# Patient Record
Sex: Female | Born: 1940 | Race: White | Hispanic: No | Marital: Single | Smoking: Never smoker
Health system: Southern US, Community
[De-identification: ages and names within clinical notes are randomized; demographics above are authoritative.]

## PROBLEM LIST (undated history)

## (undated) DIAGNOSIS — F32A Depression, unspecified: Secondary | ICD-10-CM

## (undated) DIAGNOSIS — F329 Major depressive disorder, single episode, unspecified: Secondary | ICD-10-CM

## (undated) DIAGNOSIS — E039 Hypothyroidism, unspecified: Secondary | ICD-10-CM

## (undated) DIAGNOSIS — F209 Schizophrenia, unspecified: Secondary | ICD-10-CM

## (undated) DIAGNOSIS — F79 Unspecified intellectual disabilities: Secondary | ICD-10-CM

## (undated) DIAGNOSIS — R569 Unspecified convulsions: Secondary | ICD-10-CM

## (undated) DIAGNOSIS — Q059 Spina bifida, unspecified: Secondary | ICD-10-CM

## (undated) HISTORY — DX: Spina bifida, unspecified: Q05.9

## (undated) HISTORY — DX: Unspecified convulsions: R56.9

## (undated) HISTORY — PX: CHOLECYSTECTOMY: SHX55

---

## 2004-07-20 ENCOUNTER — Ambulatory Visit: Payer: Self-pay | Admitting: Internal Medicine

## 2004-09-08 ENCOUNTER — Ambulatory Visit: Payer: Self-pay | Admitting: Diagnostic Radiology

## 2004-09-20 ENCOUNTER — Ambulatory Visit: Payer: Self-pay | Admitting: Diagnostic Radiology

## 2005-03-06 ENCOUNTER — Emergency Department: Payer: Self-pay | Admitting: Emergency Medicine

## 2005-05-15 ENCOUNTER — Ambulatory Visit: Payer: Self-pay | Admitting: Internal Medicine

## 2005-08-24 ENCOUNTER — Ambulatory Visit: Payer: Self-pay | Admitting: Internal Medicine

## 2005-08-29 ENCOUNTER — Emergency Department: Payer: Self-pay | Admitting: Emergency Medicine

## 2005-08-29 ENCOUNTER — Other Ambulatory Visit: Payer: Self-pay

## 2005-09-18 ENCOUNTER — Ambulatory Visit: Payer: Self-pay | Admitting: Internal Medicine

## 2006-11-27 ENCOUNTER — Ambulatory Visit: Payer: Self-pay | Admitting: Nurse Practitioner

## 2008-03-16 ENCOUNTER — Ambulatory Visit: Payer: Self-pay | Admitting: Internal Medicine

## 2008-04-09 ENCOUNTER — Other Ambulatory Visit: Payer: Self-pay

## 2008-04-09 ENCOUNTER — Inpatient Hospital Stay: Payer: Self-pay | Admitting: Internal Medicine

## 2011-05-16 ENCOUNTER — Ambulatory Visit: Payer: Self-pay | Admitting: Family Medicine

## 2011-06-14 ENCOUNTER — Ambulatory Visit: Payer: Self-pay | Admitting: Otolaryngology

## 2012-10-25 ENCOUNTER — Emergency Department: Payer: Self-pay | Admitting: Emergency Medicine

## 2012-10-25 LAB — CBC
HGB: 14.9 g/dL (ref 12.0–16.0)
MCH: 32.8 pg (ref 26.0–34.0)
RBC: 4.56 10*6/uL (ref 3.80–5.20)
WBC: 6.8 10*3/uL (ref 3.6–11.0)

## 2012-10-25 LAB — COMPREHENSIVE METABOLIC PANEL
Albumin: 4.1 g/dL (ref 3.4–5.0)
Alkaline Phosphatase: 119 U/L (ref 50–136)
Anion Gap: 5 — ABNORMAL LOW (ref 7–16)
Bilirubin,Total: 0.4 mg/dL (ref 0.2–1.0)
Calcium, Total: 9.4 mg/dL (ref 8.5–10.1)
Chloride: 106 mmol/L (ref 98–107)
Co2: 29 mmol/L (ref 21–32)
EGFR (Non-African Amer.): >60
Osmolality: 284 (ref 275–301)
Potassium: 4.2 mmol/L (ref 3.5–5.1)
Sodium: 140 mmol/L (ref 136–145)

## 2012-10-25 LAB — URINALYSIS, COMPLETE
Bacteria: NONE SEEN
Bilirubin,UR: NEGATIVE
Ketone: NEGATIVE
RBC,UR: 75 /HPF (ref 0–5)

## 2012-12-19 ENCOUNTER — Inpatient Hospital Stay: Payer: Self-pay | Admitting: Internal Medicine

## 2012-12-19 LAB — CBC WITH DIFFERENTIAL/PLATELET
Basophil %: 0.3 %
Eosinophil #: 0.1 10*3/uL (ref 0.0–0.7)
Eosinophil %: 2.3 %
HCT: 37.9 % (ref 35.0–47.0)
HGB: 13.5 g/dL (ref 12.0–16.0)
Lymphocyte #: 1.5 10*3/uL (ref 1.0–3.6)
MCHC: 35.4 g/dL (ref 32.0–36.0)
MCV: 95 fL (ref 80–100)
Monocyte #: 0.7 x10 3/mm (ref 0.2–0.9)
Neutrophil %: 60.6 %
RBC: 4 10*6/uL (ref 3.80–5.20)

## 2012-12-19 LAB — COMPREHENSIVE METABOLIC PANEL
Alkaline Phosphatase: 112 U/L (ref 50–136)
Anion Gap: 4 — ABNORMAL LOW (ref 7–16)
BUN: 23 mg/dL — ABNORMAL HIGH (ref 7–18)
Bilirubin,Total: 0.6 mg/dL (ref 0.2–1.0)
Chloride: 109 mmol/L — ABNORMAL HIGH (ref 98–107)
Co2: 29 mmol/L (ref 21–32)
Creatinine: 0.73 mg/dL (ref 0.60–1.30)
EGFR (African American): >60
EGFR (Non-African Amer.): >60
Glucose: 75 mg/dL (ref 65–99)
Potassium: 4.5 mmol/L (ref 3.5–5.1)
SGPT (ALT): 26 U/L (ref 12–78)
Sodium: 142 mmol/L (ref 136–145)

## 2012-12-25 LAB — CULTURE, BLOOD (SINGLE)

## 2013-05-22 ENCOUNTER — Ambulatory Visit (INDEPENDENT_AMBULATORY_CARE_PROVIDER_SITE_OTHER): Payer: Medicare Other | Admitting: Nurse Practitioner

## 2013-05-22 ENCOUNTER — Encounter: Payer: Self-pay | Admitting: Nurse Practitioner

## 2013-05-22 VITALS — BP 128/73 | HR 56

## 2013-05-22 DIAGNOSIS — E031 Congenital hypothyroidism without goiter: Secondary | ICD-10-CM

## 2013-05-22 DIAGNOSIS — F72 Severe intellectual disabilities: Secondary | ICD-10-CM

## 2013-05-22 DIAGNOSIS — G40309 Generalized idiopathic epilepsy and epileptic syndromes, not intractable, without status epilepticus: Secondary | ICD-10-CM | POA: Insufficient documentation

## 2013-05-22 MED ORDER — KEPPRA 250 MG PO TABS: 250.0000 mg | ORAL_TABLET | Freq: Two times a day (BID) | ORAL | Status: DC

## 2013-05-22 NOTE — Patient Instructions (Signed)
Per group home  

## 2013-05-22 NOTE — Progress Notes (Signed)
[facility]  PATIENT: [name] DOB: February 18, 1941  REASON FOR VISIT: Followup for seizure disorder  HISTORY OF PRESENT ILLNESS: [name], 72 year old female returns for followup. She is a patient Dr. Vickey Huger is out of the office today She has a long-standing history of mental retardation and developmental delay. She has spina bifida and is wheelchair-bound, she currently resides in a group home. She is seated in her wheelchair accompanied by 2 aides from the group time. EEG done in 2006 was abnormal with medication-induced artifact of Tranxene, no seizure  activity was seen however she was placed on Keppra and has had no seizure activity in 7 years. Appetite is good, sleeping well except occasionally wakes up about 3 am and can not get back to sleep.  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 She does not cooperate with the exam due to her severe mental retardation. She has no speech. She is incontinent of bowel and bladder and wears depends. She is on a thickened diet due to difficulty swallowing.   Continues to see Dr. Ave Filter at [facility] for behavior issues. No new medical issues. Needs  refills on her Keppra.     REVIEW OF SYSTEMS: Full 14 system review of systems performed and notable only for:  Constitutional: N/A  Cardiovascular: N/A  Ear/Nose/Throat: N/A  Skin: N/A  Eyes: N/A  Respiratory: N/A  Gastroitestinal: N/A  Genitourinary:  Incontinence Hematology/Lymphatic: N/A  Endocrine: N/A Musculoskeletal:N/A  Allergy/Immunology: N/A  Neurological: N/A Psychiatric: N/A   ALLERGIES: Allergies  Allergen Reactions  . Sulfa Antibiotics   . Tegretol [Carbamazepine]     HOME MEDICATIONS: No outpatient prescriptions prior to visit.   No facility-administered medications prior to visit.    PAST MEDICAL HISTORY: Past Medical History  Diagnosis Date  . Spinal bifida, closed   . Seizure     PAST SURGICAL  HISTORY: No past surgical history on file.  FAMILY HISTORY: No family history on file.  SOCIAL HISTORY: History   Social History  . Marital Status: Single    Spouse Name: N/A    Number of Children: N/A  . Years of Education: N/A   Occupational History  . Not on file.   Social History Main Topics  . Smoking status: Never Smoker   . Smokeless tobacco: Never Used  . Alcohol Use: No  . Drug Use: No  . Sexual Activity: Not on file   Other Topics Concern  . Not on file   Social History Narrative   Patient lives at a group home and is here today with her caregivers, Jeani Sow.   Patient does not drink any caffeine.   Patient is right-handed.   Patient is single.           PHYSICAL EXAM  Filed Vitals:   05/22/13 1028  BP: 128/73  Pulse: 56   Cannot calculate BMI with a height equal to zero.  Generalized: Well developed, in no acute distress  Musculoskeletal: Contractures at the knees elbows and wrists. Charcot foot on right   Neurological examination   Mentation: Alert , severe mental retardation, nonverbal , does not follow  commands.  Cranial nerve II-XII: Facial  asymmetry, eyes converge midline, she is unable to follow with normal eye movements. Pupils equal briskly reactive, gags are present . Motor: Moves all extremities to pain, unable to test strength  Sensory: Withdraws to pain   Coordination: Unable to perform Reflexes: 1+  and symmetric no Babinski responses  Gait and Station: Nonambulatory, wheelchair-bound right foot inverted   DIAGNOSTIC DATA (LABS, IMAGING, TESTING) -None to review  ASSESSMENT AND PLAN  72 y.o. year old female  has a past medical history of Spinal bifida, closed and Seizure. disorder. She also has mental retardation. Last seizure 7 years ago. She is a patient Dr. Vickey Huger who is out of the office today  Will continue Keppra 250 twice daily prescription given for 1 year Followup yearly and when necessary Call for any  seizure activity Nilda Riggs, [facility], [facility], APRN  [facility] [address], [address] [place], [place] [address] [phone]

## 2014-05-19 ENCOUNTER — Ambulatory Visit: Payer: Medicare Other | Admitting: Neurology

## 2014-05-20 ENCOUNTER — Ambulatory Visit: Payer: Medicare Other | Admitting: Neurology

## 2014-05-27 ENCOUNTER — Ambulatory Visit: Payer: Self-pay | Admitting: Neurology

## 2014-05-28 ENCOUNTER — Ambulatory Visit (INDEPENDENT_AMBULATORY_CARE_PROVIDER_SITE_OTHER): Payer: Medicare Other | Admitting: Neurology

## 2014-05-28 ENCOUNTER — Encounter: Payer: Self-pay | Admitting: Neurology

## 2014-05-28 VITALS — BP 166/83 | HR 67 | Resp 14

## 2014-05-28 DIAGNOSIS — F73 Profound intellectual disabilities: Secondary | ICD-10-CM | POA: Insufficient documentation

## 2014-05-28 DIAGNOSIS — Q059 Spina bifida, unspecified: Secondary | ICD-10-CM

## 2014-05-28 DIAGNOSIS — G40909 Epilepsy, unspecified, not intractable, without status epilepticus: Secondary | ICD-10-CM

## 2014-05-28 MED ORDER — KEPPRA 250 MG PO TABS: 250.0000 mg | ORAL_TABLET | Freq: Two times a day (BID) | ORAL | Status: DC

## 2014-05-28 NOTE — Progress Notes (Signed)
[facility]  PATIENT: [name] DOB: 24-Apr-1941  REASON FOR VISIT: Follow up for seizure disorder  HISTORY OF PRESENT ILLNESS: [name], 73 year old female returns for follow up.  She is accompanied by 2  Caretakers, who report no seizure activity for years.  She has a live long- history of mental retardation and developmental delay. She has spina bifida and is wheelchair-bound, she currently resides in a group home. She is seated in her wheelchair accompanied by 2 aides from the group time. EEG done in 2006 was abnormal with medication-induced artifact of Tranxene, n o seizure  activity was seen however she was placed on Keppra and has had no seizure activity in 7 years. Appetite is good, sleeping well except occasionally wakes up about 3 am and can not get back to sleep.  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  She does not cooperate with the exam due to her severe mental retardation.   She is grunting, mumbling , she is agitated. She answered initially closed questions, answered "no " to the question if she feels cold cold and Yes to the question if the door should be closed.   She is incontinent of bowel and bladder and wears depends.  She is on a thickened diet due to difficulty swallowing.   [facility].   No new medical issues.  No falls , no injury - Needs refills on her Keppra.     REVIEW OF SYSTEMS: Full 14 system review of systems performed and notable only for:  Constitutional: N/A  Cardiovascular: N/A  Ear/Nose/Throat: N/A  Skin: N/A  Eyes: N/A  Respiratory: N/A  Gastroitestinal: N/A  Genitourinary:  Incontinence Hematology/Lymphatic: N/A  Endocrine: N/A Musculoskeletal:N/A  Allergy/Immunology: N/A  Neurological: N/A Psychiatric: N/A   ALLERGIES: Allergies  Allergen Reactions  . Sulfa Antibiotics   . Tegretol [Carbamazepine]      HOME MEDICATIONS: Outpatient Prescriptions Prior to Visit  Medication Sig Dispense Refill  . acetaminophen (TYLENOL) 500 MG tablet Take 500 mg by mouth at bedtime. Take 325 2 tabs every 4 hours for temp or pain. Make sure to space 4-6 hours from bedtime dose.    Marland Kitchen. aspirin 81 MG tablet Take 81 mg by mouth daily.    . bisacodyl (DULCOLAX) 10 MG suppository Place 10 mg rectally 2 (two) times a week.    . Calcium Carb-Cholecalciferol (CALCIUM 600 + D PO) Take by mouth 2 (two) times daily.    . carbamide peroxide (DEBROX) 6.5 % otic solution Place 5 drops into both ears every 21 ( twenty-one) days.    . cetirizine (ZYRTEC) 10 MG tablet Take 10 mg by mouth as needed for allergies.    . Diphenhyd-Hydrocort-Nystatin (FIRST-DUKES MOUTHWASH MT) Use as directed in the mouth or throat.    . docusate (COLACE) 60 MG/15ML syrup Take 100 mg by mouth daily.     . folic acid (FOLVITE) 1 MG tablet Take 1 mg by mouth daily.    . Hydroactive Dressings (DUODERM HYDROACTIVE) MISC Apply topically as needed.    Marland Kitchen. KEPPRA 250 MG tablet Take 1 tablet (250 mg total) by mouth 2 (two) times daily. 60 tablet 11  . lamoTRIgine (LAMICTAL) 100 MG tablet 100 mg. 100 mg in the morning  50 mg at bedtime    . levothyroxine (SYNTHROID, LEVOTHROID) 25 MCG tablet 0.25 mcg daily. Take .5 daily    . LORazepam (ATIVAN) 0.5  MG tablet Take 0.5 mg by mouth as needed for anxiety.    . meloxicam (MOBIC) 7.5 MG tablet Take 7.5 mg by mouth 2 (two) times daily as needed for pain (hold tylenol on days taking Mobic).    . mirtazapine (REMERON) 15 MG tablet at bedtime.    . ranitidine (ZANTAC) 150 MG tablet 150 mg daily.    . Skin Protectants, Misc. (BALMEX SKIN PROTECTANT) OINT Apply topically as needed.    Marland Kitchen. trypsin-balsam-castor oil (GRANULEX) topical spray Apply 1 application topically as needed for wound care.     No facility-administered medications prior to visit.    PAST MEDICAL HISTORY: Past Medical History  Diagnosis Date  .  Spinal bifida, closed   . Seizure     PAST SURGICAL HISTORY: History reviewed. No pertinent past surgical history.  FAMILY HISTORY: History reviewed. No pertinent family history.  SOCIAL HISTORY: History   Social History  . Marital Status: Single    Spouse Name: N/A    Number of Children: N/A  . Years of Education: N/A   Occupational History  . Not on file.   Social History Main Topics  . Smoking status: Never Smoker   . Smokeless tobacco: Never Used  . Alcohol Use: No  . Drug Use: No  . Sexual Activity: Not on file   Other Topics Concern  . Not on file   Social History Narrative   Patient lives at a group home and is here today with her caregivers, Jeani SowDianna, Rhonda.   Patient does not drink any caffeine.   Patient is right-handed.   Patient is single.           PHYSICAL EXAM  Filed Vitals:   05/28/14 1158  BP: 166/83  Pulse: 67  Resp: 14     Generalized: Well developed, in no acute distress  Musculoskeletal: Contractures at the knees elbows and wrists. Charcot foot on right   Neurological examination   Mentation: Alert , severe mental retardation, nonverbal , does not follow  commands.  Cranial nerve ; right eye is smaller. Right face weakness. Facial  asymmetry, eyes converge - she has bilaterally hyperadduction, the eyes converge  she is unable to follow with normal eye movements. She smiles ,  Right eye ptosis.   Pupils equal briskly reactive, gag deferred. Motor: Moves all extremities to pain, unable to test strength , Sensory: Withdraws to pain  Coordination: Unable to perform Reflexes: 1+  and symmetric - and no Babinski responses Gait and Station: Non ambulatory, wheelchair-bound right foot inverted -   DIAGNOSTIC DATA (LABS, IMAGING, TESTING) Last labs drawn for the psychaiatrist, not available here today.   ASSESSMENT AND PLAN  73 y.o. year old female  has a past medical history of Spinal bifida, closed and Seizure. disorder. She also has  mental retardation. Last seizure 7 years ago.  Will continue Keppra 250 twice daily prescription given for 1 year Followup yearly  With NP and when necessary Call for any seizure activity    [facility] [address], [address] [place], [place] [address] [phone]

## 2014-09-28 ENCOUNTER — Ambulatory Visit: Payer: Self-pay | Admitting: Physician Assistant

## 2014-10-18 ENCOUNTER — Telehealth: Payer: Self-pay | Admitting: Neurology

## 2014-10-18 DIAGNOSIS — Q059 Spina bifida, unspecified: Secondary | ICD-10-CM

## 2014-10-18 DIAGNOSIS — G40909 Epilepsy, unspecified, not intractable, without status epilepticus: Secondary | ICD-10-CM

## 2014-10-18 DIAGNOSIS — F73 Profound intellectual disabilities: Secondary | ICD-10-CM

## 2014-10-18 NOTE — Telephone Encounter (Signed)
Request was already sent to Ins, still under review Ref Key: [id number].  I called back and spoke with Debbie.  She is aware.

## 2014-10-18 NOTE — Telephone Encounter (Signed)
Debbie with [facility] is calling in regard to authorization for Rx Keppra 250 mg. She states she electronically sent over on 3/30 and I asked her to fax to [phone].  Please call.

## 2014-10-20 ENCOUNTER — Telehealth: Payer: Self-pay

## 2014-10-20 NOTE — Telephone Encounter (Signed)
[facility] [place]([employer]) has approved the request for coverage on Keppra effective until 07/16/2015 Ref # [id number]

## 2014-10-30 LAB — WOUND CULTURE

## 2014-11-05 NOTE — Discharge Summary (Signed)
PATIENT NAME:  [name], [name] MR#:  [id number] DATE OF BIRTH:  [birth year]  DATE OF ADMISSION:  12/19/2012 DATE OF DISCHARGE:  12/22/2012  ADMITTING DIAGNOSIS: Left eye swelling and redness for the past few days.   DISCHARGE DIAGNOSES: 1.  Left-sided periorbital cellulitis, status post incision and drainage in the ED, now significant improvement in the swelling and erythema.  2.  Chronic mental retardation.  3.  Seizure disorder.  4.  Hypothyroidism.  5.  Chronic constipation.  6.  Agitation, related to her mental retardation.   PERTINENT LABS AND EVALUATIONS: Admitting glucose 75, BUN 23, creatinine 0.73, sodium 142, potassium 4.5, chloride 109, CO2 is 29. LFTs were normal.   WBC 6.1, hemoglobin 13.4, platelet count 145.   CT scan of the orbits showed periorbital cellulitis. There is extension into the preseptal region with findings consistent for early-to-mild post-central extension.   Blood cultures x 2: No growth.   HOSPITAL COURSE: Please refer to H and P done by the admitting physician. The patient is a 74 year old white female with severe mental retardation associated with agitation, who was brought in [facility] with a history of several days of erythema and swelling around her left eye. The patient was seen at the local Urgent Care and was started on Cipro. Her symptoms got worse. Brought to the ED. In the ED the patient started having drainage of that area spontaneously. She did not require incision and drainage. She was continued on IV antibiotics with clindamycin. Subsequently Unasyn was added. The patient'Reyes swelling and erythema are mostly resolved. She is doing much better and is stable for discharge.   DISCHARGE MEDICATIONS: Synthroid 0.25 mg, one-half tablet daily, mirtazapine 15 mg at bedtime, Mobic 7.5 mg 1 tablet p.o. b.i.d.,, apply topically use, as directed, Debrox ear wax removal, as previously; Duke Mouthwash, swish-and-swallow 5 mL 3 times a day as  needed, Lamictal 100, 1 tablet in the morning and half-tablet at bedtime calcium plus vitamin D 1 tablet p.o. b.i.d., Keppra 250, 1 tablet p.o. b.i.d., Zyrtec 10  1 tablet p.o. daily as needed, Zantac 150, 1 tablet p.o. daily, Ativan 0.5 as needed for agitation, folate 1 mg daily, aspirin 81 mg 1 tablet p.o. daily, Debrox 6.5, 5 to 10 drops into each ear  2 times a month, Ativan 1 mg 1 hour prior to any invasive medical procedures, Dulcolax suppositories, 1 suppository b.i.d., Colace 25 mL at bedtime, Tylenol 500, 1 tablet p.o. at bedtime, Tylenol 325 q.4-6h. p.r.n., Granulex 87 mg and 788 castor oil, 0.12 topical spray to affected area as needed, Lubriderm topically 1 tablet p.o. b.i.d., amoxicillin, clavulanic acid 875/125, 1 tablet p.o. q. 12 x 5 days, clindamycin 300, 2 tablets q.8 x 5 days.   DIET: Regular.   ACTIVITY: As tolerated.   FOLLOWUP: With Dr. Maryjane HurterFeldpausch in 1 to 2 weeks; if worsening of the eye may still need to be  brought back to the ED.  Note: 35 minutes spent on the discharge.      ____________________________ Lacie ScottsShreyang H. Allena KatzPatel, MD shp:dm D: 12/23/2012 08:56:50 ET T: 12/23/2012 10:53:15 ET JOB#: [id number]  cc: Bernhard Koskinen H. Allena KatzPatel, MD, <Dictator> Charise CarwinSHREYANG H Oluwaseun Bruyere MD ELECTRONICALLY SIGNED 12/27/2012 15:22

## 2014-11-05 NOTE — H&P (Signed)
PATIENT NAME:  [name], [name] MR#:  [id number] DATE OF BIRTH:  September 11, 1940  DATE OF ADMISSION:  12/19/2012  PRIMARY CARE PHYSICIAN:  Dr. Ellison Hughs.   CHIEF COMPLAINT:  Left eye swelling and redness for 2 to 3 days.   HISTORY OF PRESENT ILLNESS:  The history is obtained from patient's caregiver and old records. The patient has severe mental retardation, unable to give any history or review of systems. She is noncommunicative. The patient is a 74 year old Caucasian female who is a resident at the [facility] with a history of severe mental retardation, severe body contractures, spinal bifida, hypothyroidism, comes in from the [place] with left orbital cellulitis and swelling over the left large part of the eyebrows. The patient did not have any fever. She was seen at a local Urgent Care and started on Cipro two days ago. Her swelling got worse and her redness also gotten worse and is brought to the Emergency Room. She is being admitted for further evaluation and management. The patient, since she is nonconversive, is not able to tell me if her vision is affected. She somewhat gets agitated on trying to examine her.   ALLERGIES: SULFA and TEGRETOL.   HOME MEDICATIONS:  1.  Aspirin 81 mg daily.  2.  Ativan 0.5 mg as needed for agitation.  3.  Calcium with vitamin D 1 tablet b.i.d.  4.  Colace 60 mg/15 mL, 25 mL at bedtime.  5.  debrox 6.5 otic solution 2 b.i.d.  6. Dukes Mouthwash 3 times a day as needed.  7.  Dulcolax laxative suppository 10 mg on Sundays and Tuesdays.  8.  Folate 1 mg daily.  9.  Keppra 250 b.i.d.  10.  Lamictal 100 mg in the morning and 50 mg at bedtime. 11.  Lubriderm lotion 2 times a day.  12.  Remeron 15 mg at bedtime.  13.  Mobic 7.5 mg as needed.  14.  Synthroid 25 mcg 1/2 tablet daily.  FAMILY HISTORY:  Not obtainable.   REVIEW OF SYSTEMS:  Unobtainable.   SOCIAL HISTORY:  The patient is a resident at [facility].    PHYSICAL EXAMINATION:  GENERAL:  The patient is resting quietly.  VITAL SIGNS:  Temperature is 95.3, pulse is 60, blood pressure is 121/61, sats 100% on room air. She has the contractures in both upper and lower extremities. Limited exam.  HEENT:  The patient does have a periorbital edema and cellulitis over the left orbit, and there is a 1.5 x 1.5 cm abscess/welling present over the left eyebrow on the forehead along with surrounding cellulitis. No acute discharge noted. The swelling appears firm. I tried to squeeze it, could not get any discharge from it. Pupils are sluggishly reactive to light bilaterally. Oral mucosa is moist.  NECK:  Supple with no JVD. No carotid bruit.  RESPIRATORY:  Clear to auscultation bilaterally. No rales, rhonchi, respiratory distress or labored breathing.  CARDIOVASCULAR:  Both the heart sounds are normal. Rate, rhythm regular. PMI not lateralized. Chest is nontender.  ABDOMEN:  Soft, benign, nontender. No organomegaly.  NEUROLOGIC:  Again unable to do so since the patient has severe contractures, severe mental retardation.  SKIN:  No acne or rash other than cellulitis as described above.   LABORATORY, DIAGNOSTIC, AND RADIOLOGICAL DATA:  CT of the orbit shows periorbital cellulitis with extension into the preseptal region with findings concerning for early to mild post central extension. There is a soft tissue swelling  identified involving the anterior and medial periorbital regions.  CBC within normal limits. Platelet count is 145.   Comprehensive metabolic panel within normal limits except BUN of 23 and chloride of 109. ESR is 20.   ASSESSMENT AND PLAN:  A 74 year old patient with a history of severe spinal bifida, mental retardation, hypothyroidism is brought in from [place] with: 1.  Left periorbital cellulitis with left lateral forehead above the eyebrow abscess/swelling.  2.  The patient failed outpatient treatment with Cipro that was started  at Urgent Care. Her white count is stable. She has not had any fever. Since the swelling and cellulitis worsened, we will start her on IV clindamycin. Unable to send any wound discharge cultures since it appears to be dry and a firm nodule. We will continue treat with IV antibiotics. Surgical consultation for incision and drainage of the left lat forehead abscess if symptoms do not improve.  3.  Chronic severe mental retardation with behavioral issues. We will continue her psych meds which includes Lamictal and Remeron.  4.  Seizure disorder. We will continue on Keppra.  5.  A history of constipation. We will continue her stool regimen.  6.  Hypothyroidism. We will continue Synthroid. 7.  Nutrition. The patient takes a pureed diet. We will continue that.  8.  Further workup according to the patient's clinical course. Hospital admission plan was discussed with the patient's caregiver, who is present in the Emergency Room.   TIME SPENT:  50 minutes.    ____________________________ Hart Rochester Posey Pronto, MD sap:jm D: 12/19/2012 16:19:33 ET T: 12/19/2012 17:49:37 ET JOB#: [id number]  cc: Ella Guillotte A. Posey Pronto, MD, <Dictator> Sofie Hartigan, MD Ilda Basset MD ELECTRONICALLY SIGNED 12/27/2012 14:38

## 2015-01-03 ENCOUNTER — Ambulatory Visit
Admission: EM | Admit: 2015-01-03 | Discharge: 2015-01-03 | Disposition: A | Discharge status: Home / Self Care | Payer: Medicare Other | Attending: Family Medicine | Admitting: Family Medicine

## 2015-01-03 DIAGNOSIS — S60222A Contusion of left hand, initial encounter: Secondary | ICD-10-CM

## 2015-01-03 HISTORY — DX: Hypothyroidism, unspecified: E03.9

## 2015-01-03 HISTORY — DX: Schizophrenia, unspecified: F20.9

## 2015-01-03 NOTE — ED Provider Notes (Signed)
CSN: [id number]     Arrival date & time 01/03/15  1330 History   First MD Initiated Contact with Patient 01/03/15 1411     Chief Complaint  Patient presents with  . Hand Pain   (Consider location/radiation/quality/duration/timing/severity/associated sxs/prior Treatment) HPI Comments: 74 yo female with a history of spina bifida, cognitive disability/delay brought in by caregivers from her group home with a complaint of bruising and swelling of her left hand after hitting her hand against the bathroom bar yesterday while they were giving her a bath.  Patient is a 74 y.o. female presenting with hand pain. The history is provided by a caregiver.  Hand Pain    Past Medical History  Diagnosis Date  . Spinal bifida, closed   . Seizure   . Schizophrenia   . Hypothyroidism    No past surgical history on file. No family history on file. History  Substance Use Topics  . Smoking status: Never Smoker   . Smokeless tobacco: Never Used  . Alcohol Use: No   OB History    No data available     Review of Systems  Allergies  Sulfa antibiotics and Tegretol  Home Medications   Prior to Admission medications   Medication Sig Start Date End Date Taking? Authorizing Provider  OLANZAPINE PO Take by mouth as needed.   Yes Historical Provider, MD  acetaminophen (TYLENOL) 500 MG tablet Take 500 mg by mouth at bedtime. Take 325 2 tabs every 4 hours for temp or pain. Make sure to space 4-6 hours from bedtime dose.    Historical Provider, MD  aspirin 81 MG tablet Take 81 mg by mouth daily.    Historical Provider, MD  bisacodyl (DULCOLAX) 10 MG suppository Place 10 mg rectally 2 (two) times a week.    Historical Provider, MD  Calcium Carb-Cholecalciferol (CALCIUM 600 + D PO) Take by mouth 2 (two) times daily.    Historical Provider, MD  carbamide peroxide (DEBROX) 6.5 % otic solution Place 5 drops into both ears every 21 ( twenty-one) days.    Historical Provider, MD  cetirizine (ZYRTEC) 10 MG  tablet Take 10 mg by mouth as needed for allergies.    Historical Provider, MD  Diphenhyd-Hydrocort-Nystatin (FIRST-DUKES MOUTHWASH MT) Use as directed in the mouth or throat.    Historical Provider, MD  docusate (COLACE) 60 MG/15ML syrup Take 100 mg by mouth daily.     Historical Provider, MD  folic acid (FOLVITE) 1 MG tablet Take 1 mg by mouth daily.    Historical Provider, MD  Hydroactive Dressings (DUODERM HYDROACTIVE) MISC Apply topically as needed.    Historical Provider, MD  KEPPRA 250 MG tablet Take 1 tablet (250 mg total) by mouth 2 (two) times daily. 05/28/14   Porfirio Mylar Dohmeier, MD  lamoTRIgine (LAMICTAL) 100 MG tablet 100 mg. 100 mg in the morning  50 mg at bedtime 05/21/13   Historical Provider, MD  levothyroxine (SYNTHROID, LEVOTHROID) 25 MCG tablet 0.25 mcg daily. Take .5 daily 05/21/13   Historical Provider, MD  LORazepam (ATIVAN) 0.5 MG tablet Take 0.5 mg by mouth as needed for anxiety.    Historical Provider, MD  meloxicam (MOBIC) 7.5 MG tablet Take 7.5 mg by mouth 2 (two) times daily as needed for pain (hold tylenol on days taking Mobic).    Historical Provider, MD  mirtazapine (REMERON) 15 MG tablet at bedtime. 05/21/13   Historical Provider, MD  ranitidine (ZANTAC) 150 MG tablet 150 mg daily. 05/21/13   Historical Provider, MD  Skin Protectants, Misc. (BALMEX SKIN PROTECTANT) OINT Apply topically as needed.    Historical Provider, MD  trypsin-balsam-castor oil [facility]) topical spray Apply 1 application topically as needed for wound care.    Historical Provider, MD   BP 179/112 mmHg  Pulse 77  Temp(Src) 97 F (36.1 C) (Tympanic)  Resp 16  SpO2 100% Physical Exam  Constitutional: She appears well-developed and well-nourished. No distress.  Musculoskeletal:       Left hand: She exhibits tenderness and swelling. She exhibits normal range of motion, normal two-point discrimination, normal capillary refill and no laceration.       Hands: Difficult to examine patient as she was  uncooperative due to her underlying cognitive condition: Left hand with mild edema, hematoma and tenderness over the dorsal aspect; moving fingers normally  Skin: She is not diaphoretic.  Nursing note and vitals reviewed.   ED Course  Procedures (including critical care time) Labs Review Labs Reviewed - No data to display  Imaging Review No results found.   MDM   1. Hand contusion, left, initial encounter    Plan: 1.  diagnosis reviewed with patient's caregivers 2. Recommend supportive treatment with otc analgesics, rest, ice, elevation  3. Discussed with caregivers that if symptoms worsen or are not improving, would recommend going to hospital for possible x-rays under sedation  Payton Mccallum, MD 01/03/15 2048

## 2015-01-03 NOTE — ED Notes (Signed)
Presents for left hand pain. Per caregivers, patient hit her left hand against the bathroom bar, now red, and swollen. Occurred yesterday.

## 2015-01-04 ENCOUNTER — Emergency Department: Payer: Medicare Other

## 2015-01-04 ENCOUNTER — Emergency Department
Admission: EM | Admit: 2015-01-04 | Discharge: 2015-01-04 | Disposition: A | Discharge status: Home / Self Care | Payer: Medicare Other | Attending: Emergency Medicine | Admitting: Emergency Medicine

## 2015-01-04 ENCOUNTER — Encounter: Payer: Self-pay | Admitting: Emergency Medicine

## 2015-01-04 DIAGNOSIS — S0083XA Contusion of other part of head, initial encounter: Secondary | ICD-10-CM | POA: Diagnosis not present

## 2015-01-04 DIAGNOSIS — S0011XA Contusion of right eyelid and periocular area, initial encounter: Secondary | ICD-10-CM | POA: Diagnosis not present

## 2015-01-04 DIAGNOSIS — T07XXXA Unspecified multiple injuries, initial encounter: Secondary | ICD-10-CM

## 2015-01-04 DIAGNOSIS — Y9389 Activity, other specified: Secondary | ICD-10-CM | POA: Diagnosis not present

## 2015-01-04 DIAGNOSIS — S60222A Contusion of left hand, initial encounter: Secondary | ICD-10-CM | POA: Diagnosis not present

## 2015-01-04 DIAGNOSIS — F72 Severe intellectual disabilities: Secondary | ICD-10-CM | POA: Diagnosis not present

## 2015-01-04 DIAGNOSIS — S0003XA Contusion of scalp, initial encounter: Secondary | ICD-10-CM | POA: Diagnosis not present

## 2015-01-04 DIAGNOSIS — Z7982 Long term (current) use of aspirin: Secondary | ICD-10-CM | POA: Insufficient documentation

## 2015-01-04 DIAGNOSIS — M79642 Pain in left hand: Secondary | ICD-10-CM

## 2015-01-04 DIAGNOSIS — Y998 Other external cause status: Secondary | ICD-10-CM | POA: Insufficient documentation

## 2015-01-04 DIAGNOSIS — Y9289 Other specified places as the place of occurrence of the external cause: Secondary | ICD-10-CM | POA: Insufficient documentation

## 2015-01-04 DIAGNOSIS — Z79899 Other long term (current) drug therapy: Secondary | ICD-10-CM | POA: Diagnosis not present

## 2015-01-04 DIAGNOSIS — W228XXA Striking against or struck by other objects, initial encounter: Secondary | ICD-10-CM | POA: Diagnosis not present

## 2015-01-04 DIAGNOSIS — S0990XA Unspecified injury of head, initial encounter: Secondary | ICD-10-CM

## 2015-01-04 HISTORY — DX: Major depressive disorder, single episode, unspecified: F32.9

## 2015-01-04 HISTORY — DX: Depression, unspecified: F32.A

## 2015-01-04 HISTORY — DX: Unspecified intellectual disabilities: F79

## 2015-01-04 MED ORDER — MIDAZOLAM HCL 5 MG/5ML IJ SOLN
INTRAMUSCULAR | Status: AC
Start: 2015-01-04 — End: 2015-01-04
  Administered 2015-01-04: 4 mg via INTRAMUSCULAR
  Filled 2015-01-04: qty 5

## 2015-01-04 MED ORDER — MIDAZOLAM HCL 5 MG/5ML IJ SOLN
4.0000 mg | Freq: Once | INTRAMUSCULAR | Status: AC
Start: 2015-01-04 — End: 2015-01-04
  Administered 2015-01-04: 4 mg via INTRAMUSCULAR

## 2015-01-04 NOTE — ED Notes (Signed)
Pt is MR, has been brought here by caregivers, pt was seen at [facility] urgent care for bruising of her hand yesterday, was told to come back if there was more swelling, also, bruising of right eye noted, caregiver states the bruise goes all the way back on her scalp, pt does not appear in pain, verbally denies pain as well.

## 2015-01-04 NOTE — Discharge Instructions (Signed)
Contusion °A contusion is a deep bruise. Contusions are the result of an injury that caused bleeding under the skin. The contusion may turn blue, purple, or yellow. Minor injuries will give you a painless contusion, but more severe contusions may stay painful and swollen for a few weeks.  °CAUSES  °A contusion is usually caused by a blow, trauma, or direct force to an area of the body. °SYMPTOMS  °· Swelling and redness of the injured area. °· Bruising of the injured area. °· Tenderness and soreness of the injured area. °· Pain. °DIAGNOSIS  °The diagnosis can be made by taking a history and physical exam. An X-ray, CT scan, or MRI may be needed to determine if there were any associated injuries, such as fractures. °TREATMENT  °Specific treatment will depend on what area of the body was injured. In general, the best treatment for a contusion is resting, icing, elevating, and applying cold compresses to the injured area. Over-the-counter medicines may also be recommended for pain control. Ask your caregiver what the best treatment is for your contusion. °HOME CARE INSTRUCTIONS  °· Put ice on the injured area. °¨ Put ice in a plastic bag. °¨ Place a towel between your skin and the bag. °¨ Leave the ice on for 15-20 minutes, 3-4 times a day, or as directed by your health care provider. °· Only take over-the-counter or prescription medicines for pain, discomfort, or fever as directed by your caregiver. Your caregiver may recommend avoiding anti-inflammatory medicines (aspirin, ibuprofen, and naproxen) for 48 hours because these medicines may increase bruising. °· Rest the injured area. °· If possible, elevate the injured area to reduce swelling. °SEEK IMMEDIATE MEDICAL CARE IF:  °· You have increased bruising or swelling. °· You have pain that is getting worse. °· Your swelling or pain is not relieved with medicines. °MAKE SURE YOU:  °· Understand these instructions. °· Will watch your condition. °· Will get help right  away if you are not doing well or get worse. °Document Released: 04/11/2005 Document Revised: 07/07/2013 Document Reviewed: 05/07/2011 °ExitCare® Patient Information ©2015 [employer]. This information is not intended to replace advice given to you by your health care provider. Make sure you discuss any questions you have with your health care provider. ° °

## 2015-01-04 NOTE — ED Provider Notes (Signed)
[facility] Emergency Department Provider Note  Time seen: 2:55 PM  I have reviewed the triage vital signs and the nursing notes.   HISTORY  Chief Complaint Hand Pain and Head Injury    HPI [name] is a 74 y.o. female with a past medical history of schizophrenia, hypothyroidism, depression, spina bifida, mental retardation who presents the emergency department with left hand pain, and bruising to her head. According to her caregivers she hit her hand on the bathtub yesterday, there was moderate swelling and ecchymosis to the hand and brought her to urgent care with the patient was not cooperative and cannot get an x-ray done. They state the hand swelling has continued to increase and they also noticed bruising of her head and around her eye today so they brought her to the emergency department for evaluation. Nobody witnessed her having a fall. Patient has mental retardation, but denies any pain.     Past Medical History  Diagnosis Date  . Spinal bifida, closed   . Seizure   . Schizophrenia   . Hypothyroidism   . Depression   . MR (mental retardation)     Patient Active Problem List   Diagnosis Date Noted  . Seizure disorder 05/28/2014  . Mental retardation, idiopathic profound 05/28/2014  . 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 No pertinent past surgical history.  Current Outpatient Rx  Name  Route  Sig  Dispense  Refill  . acetaminophen (TYLENOL) 500 MG tablet   Oral   Take 500 mg by mouth at bedtime. Take 325 2 tabs every 4 hours for temp or pain. Make sure to space 4-6 hours from bedtime dose.         Marland Kitchen aspirin 81 MG tablet   Oral   Take 81 mg by mouth daily.         . bisacodyl (DULCOLAX) 10 MG suppository   Rectal   Place 10 mg  rectally 2 (two) times a week.         . Calcium Carb-Cholecalciferol (CALCIUM 600 + D PO)   Oral   Take by mouth 2 (two) times daily.         . carbamide peroxide (DEBROX) 6.5 % otic solution   Both Ears   Place 5 drops into both ears every 21 ( twenty-one) days.         . cetirizine (ZYRTEC) 10 MG tablet   Oral   Take 10 mg by mouth as needed for allergies.         . Diphenhyd-Hydrocort-Nystatin (FIRST-DUKES MOUTHWASH MT)   Mouth/Throat   Use as directed in the mouth or throat.         . docusate (COLACE) 60 MG/15ML syrup   Oral   Take 100 mg by mouth daily.          . folic acid (FOLVITE) 1 MG tablet   Oral   Take 1 mg by mouth daily.         . Hydroactive Dressings (DUODERM HYDROACTIVE) MISC   Apply externally   Apply topically as needed.         Marland Kitchen KEPPRA 250 MG tablet   Oral   Take 1 tablet (250 mg total) by mouth 2 (two) times daily.   60 tablet   11     Dispense as  written.   . lamoTRIgine (LAMICTAL) 100 MG tablet      100 mg. 100 mg in the morning  50 mg at bedtime         . levothyroxine (SYNTHROID, LEVOTHROID) 25 MCG tablet      0.25 mcg daily. Take .5 daily         . LORazepam (ATIVAN) 0.5 MG tablet   Oral   Take 0.5 mg by mouth as needed for anxiety.         . meloxicam (MOBIC) 7.5 MG tablet   Oral   Take 7.5 mg by mouth 2 (two) times daily as needed for pain (hold tylenol on days taking Mobic).         . mirtazapine (REMERON) 15 MG tablet      at bedtime.         Marland Kitchen OLANZAPINE PO   Oral   Take by mouth as needed.         . ranitidine (ZANTAC) 150 MG tablet      150 mg daily.         . Skin Protectants, Misc. (BALMEX SKIN PROTECTANT) OINT   Apply externally   Apply topically as needed.         Marland Kitchen trypsin-balsam-castor oil (GRANULEX) topical spray   Topical   Apply 1 application topically as needed for wound care.           Allergies Sulfa antibiotics and Tegretol  No family history on  file.  Social History History  Substance Use Topics  . Smoking status: Never Smoker   . Smokeless tobacco: Never Used  . Alcohol Use: No    Review of Systems Largely negative 10 point review of systems, but review of systems Limited by mental retardation.   ____________________________________________   PHYSICAL EXAM:  VITAL SIGNS: ED Triage Vitals  Enc Vitals Group     BP 01/04/15 1218 144/52 mmHg     Pulse Rate 01/04/15 1218 71     Resp 01/04/15 1218 18     Temp --      Temp src --      SpO2 01/04/15 1218 99 %     Weight 01/04/15 1218 115 lb (52.164 kg)     Height 01/04/15 1218  (1.499 m)     Head Cir --      Peak Flow --      Pain Score --      Pain Loc --      Pain Edu? --      Excl. in GC? --     Constitutional: Alert, in no distress. Eyes: Normal pupil exam, 2 mm bilateral. Patient has ecchymosis on the right side periorbital. ENT   Head: Ecchymosis to the right scalp, and right frontal head.   Nose: No congestion/rhinnorhea. No deformity.   Mouth/Throat: Mucous membranes are moist. No injuries noted. Cardiovascular: Normal rate, regular rhythm. No murmur Respiratory: Normal respiratory effort without tachypnea nor retractions. Breath sounds are clear  Gastrointestinal: Soft and nontender. No distention.   Musculoskeletal: Mild swelling especially of the third digit of the left hand, with surrounding ecchymosis. Appears to be tender to palpation. Neurologic:  Normal speech and language. No gross focal neurologic deficits. Patient at baseline per caregiver. Skin:  Skin is warm Psychiatric: Mood and affect are normal. Speech and behavior are normal.   ____________________________________________    RADIOLOGY  CT head does not show any acute abnormalities. Hand x-ray with possible fracture.  ____________________________________________  INITIAL IMPRESSION / ASSESSMENT AND PLAN / ED COURSE  Pertinent labs & imaging results that were  available during my care of the patient were reviewed by me and considered in my medical decision making (see chart for details).  Patient with an unwitnessed fall, with ecchymosis of the right scalp and forehead. Patient also with left hand ecchymosis and swelling. No other injuries identified on exam. Patient with mental retardation, and is not cooperative. We will dose the patient with IM Versed for sedation, and attempt to obtain a CT scan of the head and x-ray of the left hand.  CT head does not show any acute abnormalities. X-ray of the hand shows a possible fracture to the dorsal osteophyte at the base of the third middle phalanx. Given the possible subtle fracture, and the patient's history of mental retardation and combativeness, I do not believe the patient would tolerate a splint, we will discharge the patient home with primary care follow-up, and Tylenol as needed. The caregivers are agreeable to this plan.  ____________________________________________   FINAL CLINICAL IMPRESSION(S) / ED DIAGNOSES  Head injury Left hand injury/contusion   Minna Antis, MD 01/04/15 [phone]

## 2015-03-05 ENCOUNTER — Encounter: Payer: Self-pay | Admitting: Emergency Medicine

## 2015-03-05 ENCOUNTER — Ambulatory Visit
Admission: EM | Admit: 2015-03-05 | Discharge: 2015-03-05 | Disposition: A | Discharge status: Home / Self Care | Payer: Medicare Other | Attending: Family Medicine | Admitting: Family Medicine

## 2015-03-05 DIAGNOSIS — M1990 Unspecified osteoarthritis, unspecified site: Secondary | ICD-10-CM

## 2015-03-05 NOTE — ED Notes (Signed)
Patient is wheelchair bound. Care home not sure of height and weight

## 2015-03-05 NOTE — ED Notes (Signed)
Fingers swollen on left hand today.

## 2015-03-05 NOTE — Discharge Instructions (Signed)
Arthritis, Nonspecific °Arthritis is pain, redness, warmth, or puffiness (inflammation) of a joint. The joint may be stiff or hurt when you move it. One or more joints may be affected. There are many types of arthritis. Your doctor may not know what type you have right away. The most common cause of arthritis is wear and tear on the joint (osteoarthritis). °HOME CARE  °· Only take medicine as told by your doctor. °· Rest the joint as much as possible. °· Raise (elevate) your joint if it is puffy. °· Use crutches if the painful joint is in your leg. °· Drink enough fluids to keep your pee (urine) clear or pale yellow. °· Follow your doctor's diet instructions. °· Use cold packs for very bad joint pain for 10 to 15 minutes every hour. Ask your doctor if it is okay for you to use hot packs. °· Exercise as told by your doctor. °· Take a warm shower if you have stiffness in the morning. °· Move your sore joints throughout the day. °GET HELP RIGHT AWAY IF:  °· You have a fever. °· You have very bad joint pain, puffiness, or redness. °· You have many joints that are painful and puffy. °· You are not getting better with treatment. °· You have very bad back pain or leg weakness. °· You cannot control when you poop (bowel movement) or pee (urinate). °· You do not feel better in 24 hours or are getting worse. °· You are having side effects from your medicine. °MAKE SURE YOU:  °· Understand these instructions. °· Will watch your condition. °· Will get help right away if you are not doing well or get worse. °Document Released: 09/26/2009 Document Revised: 01/01/2012 Document Reviewed: 09/26/2009 °ExitCare® Patient Information ©2015 [employer]. This information is not intended to replace advice given to you by your health care provider. Make sure you discuss any questions you have with your health care provider. ° °

## 2015-03-05 NOTE — ED Provider Notes (Signed)
Patient presents today with caregivers. Patient is unable to communicate with me regarding any signs or symptoms that she has. Caregiver states that this morning they noted that her left second and third digits were swollen. There has not been any history of trauma or injury that they have seen. The patient does not appear to be in any discomfort according to them. She was seen here back in June for similar problem. She later went to the ER to have x-rays done that showed osteoarthritis and a possible fracture of an osteophyte of the third digit. Caregivers have not noticed any ecchymosis of the area.  ROS: Negative except mentioned above.  GENERAL: NAD MSK: L Hand-mild swelling of the 2nd and 3rd PIP Joints, no significant erythema or warmth of the area, there is slight contracture of the digits but similar to the right, patient does not appear to be in any distress when touching the area, vascularly intact  A/P: L Digits Osteoarthritis- I don't feel that the patient will be able to get x-rays here in the office without being sedated, there does not appear to be any acute injury that has happened but if the caregivers notice that the patient is in pain I would recommend that she go to the ER for further workup and treatment. The caregivers can ice the area 2-3 times a day if the patient allows for 15 minutes at a time. It appears that patient is already on Tylenol. Follow-up with primary care provider next week.  Jolene Provost, MD 03/05/15 1310

## 2015-04-09 ENCOUNTER — Ambulatory Visit: Payer: Medicare Other

## 2015-04-09 ENCOUNTER — Encounter: Payer: Self-pay | Admitting: Emergency Medicine

## 2015-04-09 ENCOUNTER — Ambulatory Visit
Admission: EM | Admit: 2015-04-09 | Discharge: 2015-04-09 | Disposition: A | Discharge status: Home / Self Care | Payer: Medicare Other | Attending: Family Medicine | Admitting: Family Medicine

## 2015-04-09 DIAGNOSIS — L03032 Cellulitis of left toe: Secondary | ICD-10-CM | POA: Diagnosis not present

## 2015-04-09 DIAGNOSIS — L97529 Non-pressure chronic ulcer of other part of left foot with unspecified severity: Secondary | ICD-10-CM

## 2015-04-09 MED ORDER — CEPHALEXIN 500 MG PO CAPS: 500.0000 mg | ORAL_CAPSULE | Freq: Three times a day (TID) | ORAL | Status: DC

## 2015-04-09 NOTE — ED Notes (Signed)
Caregiver reports 2nd toe on patient's left foot is red, "looks infected" and smells stronger than normal. Put cream on it last night but looks worse today. Has h/o cellulitis

## 2015-04-09 NOTE — ED Provider Notes (Signed)
CSN: [id number]     Arrival date & time 04/09/15  1044 History   First MD Initiated Contact with Patient 04/09/15 1215     Chief Complaint  Patient presents with  . Toe Injury    2nd toe on left foot   (Consider location/radiation/quality/duration/timing/severity/associated sxs/prior Treatment) HPI Comments: 74 yo female with a h/o spina bifida, cognitive disability here with caregivers from group home with concern for a left second toe sore, with redness and bad odor. Patient otherwise in her usual state of health.  The history is provided by a caregiver.    Past Medical History  Diagnosis Date  . Spinal bifida, closed   . Seizure   . Schizophrenia   . Hypothyroidism   . Depression   . MR (mental retardation)    History reviewed. No pertinent past surgical history. No family history on file. Social History  Substance Use Topics  . Smoking status: Never Smoker   . Smokeless tobacco: Never Used  . Alcohol Use: No   OB History    No data available     Review of Systems  Allergies  Sulfa antibiotics and Tegretol  Home Medications   Prior to Admission medications   Medication Sig Start Date End Date Taking? Authorizing Provider  acetaminophen (TYLENOL) 500 MG tablet Take 500 mg by mouth at bedtime. Take 325 2 tabs every 4 hours for temp or pain. Make sure to space 4-6 hours from bedtime dose.   Yes Historical Provider, MD  aspirin 81 MG tablet Take 81 mg by mouth daily.   Yes Historical Provider, MD  bacitracin ointment Apply 1 application topically 2 (two) times daily.   Yes Historical Provider, MD  bisacodyl (DULCOLAX) 10 MG suppository Place 10 mg rectally 2 (two) times a week.   Yes Historical Provider, MD  Calcium Carb-Cholecalciferol (CALCIUM 600 + D PO) Take by mouth 2 (two) times daily.   Yes Historical Provider, MD  carbamide peroxide (DEBROX) 6.5 % otic solution Place 5 drops into both ears every 21 ( twenty-one) days.   Yes Historical Provider, MD  cetirizine  (ZYRTEC) 10 MG tablet Take 10 mg by mouth as needed for allergies.   Yes Historical Provider, MD  Diphenhyd-Hydrocort-Nystatin (FIRST-DUKES MOUTHWASH MT) Use as directed in the mouth or throat.   Yes Historical Provider, MD  docusate (COLACE) 60 MG/15ML syrup Take 100 mg by mouth daily.    Yes Historical Provider, MD  folic acid (FOLVITE) 1 MG tablet Take 1 mg by mouth daily.   Yes Historical Provider, MD  Hydroactive Dressings (DUODERM HYDROACTIVE) MISC Apply topically as needed.   Yes Historical Provider, MD  KEPPRA 250 MG tablet Take 1 tablet (250 mg total) by mouth 2 (two) times daily. 05/28/14  Yes Porfirio Mylar Dohmeier, MD  lamoTRIgine (LAMICTAL) 100 MG tablet 100 mg. 100 mg in the morning  50 mg at bedtime 05/21/13  Yes Historical Provider, MD  levothyroxine (SYNTHROID, LEVOTHROID) 25 MCG tablet 0.25 mcg daily. Take .5 daily 05/21/13  Yes Historical Provider, MD  LORazepam (ATIVAN) 0.5 MG tablet Take 0.5 mg by mouth as needed for anxiety.   Yes Historical Provider, MD  mirtazapine (REMERON) 15 MG tablet at bedtime. 05/21/13  Yes Historical Provider, MD  OLANZAPINE PO Take by mouth as needed.   Yes Historical Provider, MD  ranitidine (ZANTAC) 150 MG tablet 150 mg daily. 05/21/13  Yes Historical Provider, MD  Skin Protectants, Misc. (BALMEX SKIN PROTECTANT) OINT Apply topically as needed.   Yes Historical Provider,  MD  trypsin-balsam-castor oil (GRANULEX) topical spray Apply 1 application topically as needed for wound care.   Yes Historical Provider, MD  cephALEXin (KEFLEX) 500 MG capsule Take 1 capsule (500 mg total) by mouth 3 (three) times daily. 04/09/15   Payton Mccallum, MD  meloxicam (MOBIC) 7.5 MG tablet Take 7.5 mg by mouth 2 (two) times daily as needed for pain (hold tylenol on days taking Mobic).    Historical Provider, MD   Meds Ordered and Administered this Visit  Medications - No data to display  BP 154/75 mmHg  Pulse 71  Temp(Src) 97.7 F (36.5 C) (Axillary)  Resp 20  Ht   Wt 120 lb  (54.432 kg)  SpO2 98% No data found.   Physical Exam  Constitutional: She appears well-developed and well-nourished. No distress.  Musculoskeletal:       Left foot: There is tenderness, bony tenderness and deformity.  Chronic deformities to toes; 2nd toe with approximately 1cm superficial skin ulceration with tenderness, slight purulent drainage and surrounding blanchable erythema; pressure point area from big toe deformity   Skin: She is not diaphoretic.  Nursing note and vitals reviewed.   ED Course  Procedures (including critical care time)  Labs Review Labs Reviewed - No data to display  Imaging Review Dg Toe 2nd Left  04/09/2015   CLINICAL DATA:  Second toe ulcer.  Cellulitis.  Contractures.  EXAM: LEFT SECOND TOE  COMPARISON:  None.  FINDINGS: The bones are diffusely osteopenic. Due to contractures/ flexion deformities at the PIP joints, the second toe middle and distal phalanges are not well evaluated. There is diffuse soft tissue swelling involving the second toe. No osseous erosion is seen involving the proximal phalanx. No fracture or dislocation is identified. No radiopaque foreign body is seen.  IMPRESSION: Left second toe soft tissue swelling. No erosion identified involving the proximal phalanx. Limited evaluation of the mid and distal phalanges as above.   Electronically Signed   By: Sebastian Ache M.D.   On: 04/09/2015 13:01     Visual Acuity Review  Right Eye Distance:   Left Eye Distance:   Bilateral Distance:    Right Eye Near:   Left Eye Near:    Bilateral Near:         MDM   1. Cellulitis of toe of left foot   2. Ulcer of toe of left foot, with unspecified severity     Discharge Medication List as of 04/09/2015  1:36 PM    START taking these medications   Details  cephALEXin (KEFLEX) 500 MG capsule Take 1 capsule (500 mg total) by mouth 3 (three) times daily., Starting 04/09/2015, Until Discontinued, Normal      Plan: 1. x-ray results and  diagnosis reviewed with patient's caregivers 2. rx as per orders; risks, benefits, potential side effects reviewed with patient 3. Recommend supportive treatment with wound care 4. Recommend referral to wound care clinic at [facility] 5. F/u prn  Payton Mccallum, MD 04/09/15 [phone]

## 2015-04-18 ENCOUNTER — Encounter: Payer: Medicare Other | Attending: Surgery | Admitting: Surgery

## 2015-04-18 DIAGNOSIS — Q059 Spina bifida, unspecified: Secondary | ICD-10-CM | POA: Insufficient documentation

## 2015-04-18 DIAGNOSIS — F209 Schizophrenia, unspecified: Secondary | ICD-10-CM | POA: Insufficient documentation

## 2015-04-18 DIAGNOSIS — G40909 Epilepsy, unspecified, not intractable, without status epilepticus: Secondary | ICD-10-CM | POA: Diagnosis not present

## 2015-04-18 DIAGNOSIS — F79 Unspecified intellectual disabilities: Secondary | ICD-10-CM | POA: Diagnosis not present

## 2015-04-18 DIAGNOSIS — X58XXXA Exposure to other specified factors, initial encounter: Secondary | ICD-10-CM | POA: Diagnosis not present

## 2015-04-18 DIAGNOSIS — S91105A Unspecified open wound of left lesser toe(s) without damage to nail, initial encounter: Secondary | ICD-10-CM | POA: Diagnosis not present

## 2015-04-18 DIAGNOSIS — F329 Major depressive disorder, single episode, unspecified: Secondary | ICD-10-CM | POA: Diagnosis not present

## 2015-04-18 DIAGNOSIS — L89893 Pressure ulcer of other site, stage 3: Secondary | ICD-10-CM | POA: Diagnosis not present

## 2015-04-19 NOTE — Progress Notes (Signed)
Reyes, [name] ([id number]) Visit Report for 04/18/2015 Abuse/Suicide Risk Screen Details Patient Name: [name], [name] 04/18/2015 8:45 Date of Service: AM Medical Record [id number] Number: Patient Account Number: [id number] 11/20/1940 (74 y.o. Treating RN: Curtis Sites Date of Birth/Sex: Female) Other Clinician: Primary Care Physician: Yetta Flock Referring Physician: Payton Mccallum Physician/Extender: Weeks in Treatment: 0 Abuse/Suicide Risk Screen Items Answer ABUSE/SUICIDE RISK SCREEN: Has anyone close to you tried to hurt or harm you recentlyo No Do you feel uncomfortable with anyone in your familyo No Has anyone forced you do things that you didnot want to doo No Do you have any thoughts of harming yourselfo No Patient displays signs or symptoms of abuse and/or neglect. No Electronic Signature(s) Signed: 04/18/2015 4:58:54 PM By: Curtis Sites Entered By: Curtis Sites on 04/18/2015 09:01:21 Creelman, Octaviano Batty ([id number]) -------------------------------------------------------------------------------- Activities of Daily Living Details Patient Name: [name] 04/18/2015 8:45 Date of Service: AM Medical Record [id number] Number: Patient Account Number: [id number] 08-26-1940 (74 y.o. Treating RN: Curtis Sites Date of Birth/Sex: Female) Other Clinician: Primary Care Physician: Yetta Flock Referring Physician: Payton Mccallum Physician/Extender: Weeks in Treatment: 0 Activities of Daily Living Items Answer Activities of Daily Living (Please select one for each item) Drive Automobile Not Able Take Medications Not Able Use Telephone Not Able Care for Appearance Not Able Use Toilet Not Able Bath / Shower Need Assistance Dress Self Need Assistance Feed Self Need Assistance Walk Not Able Get In / Out Bed Need Assistance Housework Need Assistance Prepare Meals Not Able Handle Money Not  Able Shop for Self Not Able Electronic Signature(s) Signed: 04/18/2015 4:58:54 PM By: Curtis Sites Entered By: Curtis Sites on 04/18/2015 09:02:11 Stacie Coach ([id number]) -------------------------------------------------------------------------------- Education Assessment Details Patient Name: Stacie Coach. 04/18/2015 8:45 Date of Service: AM Medical Record [id number] Number: Patient Account Number: [id number] [birth year] (74 y.o. Treating RN: Curtis Sites Date of Birth/Sex: Female) Other Clinician: Primary Care Physician: Yetta Flock Referring Physician: Payton Mccallum Physician/Extender: Tania Ade in Treatment: 0 Primary Learner Assessed: Caregiver Reason Patient is not Primary Learner: MR Learning Preferences/Education Level/Primary Language Learning Preference: Explanation, Demonstration, Printed Material Highest Education Level: College or Above Preferred Language: English Cognitive Barrier Assessment/Beliefs Language Barrier: No Translator Needed: No Memory Deficit: No Emotional Barrier: No Cultural/Religious Beliefs Affecting Medical No Care: Physical Barrier Assessment Impaired Vision: No Impaired Hearing: No Decreased Hand dexterity: Yes Knowledge/Comprehension Assessment Knowledge Level: Medium Comprehension Level: Medium Ability to understand written Medium instructions: Ability to understand verbal Medium instructions: Motivation Assessment Anxiety Level: Calm Cooperation: Cooperative Education Importance: Acknowledges Need Interest in Health Problems: Asks Questions Perception: Coherent Willingness to Engage in Self- Medium Management Activities: Medium Pascale, Sosie S. ([id number]) Readiness to Engage in Self- Management Activities: Electronic Signature(s) Signed: 04/18/2015 4:58:54 PM By: Curtis Sites Entered By: Curtis Sites on 04/18/2015 09:02:55 Isham, Ariyon Kathie Rhodes  ([id number]) -------------------------------------------------------------------------------- Fall Risk Assessment Details Patient Name: Stacie Coach. 04/18/2015 8:45 Date of Service: AM Medical Record [id number] Number: Patient Account Number: [id number] 02-20-1941 (74 y.o. Treating RN: Curtis Sites Date of Birth/Sex: Female) Other Clinician: Primary Care Physician: Yetta Flock Referring Physician: Payton Mccallum Physician/Extender: Weeks in Treatment: 0 Fall Risk Assessment Items FALL RISK ASSESSMENT: History of falling - immediate or within 3 months 0 No Secondary diagnosis 0 No Ambulatory aid None/bed rest/wheelchair/nurse 0 Yes Crutches/cane/walker 0 No FurnituBEYLA, LONEYs/Saline Lock 0 No Gait/Training Normal/bed rest/immobile 0 No Weak 10 Yes Impaired 0 No Mental Status Oriented  to own ability 0 No Electronic Signature(s) Signed: 04/18/2015 4:58:54 PM By: Curtis Sites Entered By: Curtis Sites on 04/18/2015 09:06:14 Lievanos, Soledad Kathie Rhodes ([id number]) -------------------------------------------------------------------------------- Foot Assessment Details Patient Name: Stacie Coach. 04/18/2015 8:45 Date of Service: AM Medical Record [id number] Number: Patient Account Number: [id number] 1941-04-05 (74 y.o. Treating RN: Curtis Sites Date of Birth/Sex: Female) Other Clinician: Primary Care Physician: Yetta Flock Referring Physician: Payton Mccallum Physician/Extender: Weeks in Treatment: 0 Foot Assessment Items Site Locations + = Sensation present, - = Sensation absent, C = Callus, U = Ulcer R = Redness, W = Warmth, M = Maceration, PU = Pre-ulcerative lesion F = Fissure, S = Swelling, D = Dryness Assessment Right: Left: Other Deformity: Yes Yes Prior Foot Ulcer: No No Prior Amputation: No No Charcot Joint: No No Ambulatory Status: Non-ambulatory Assistance Device: Wheelchair Gait:  Surveyor, mining) Signed: 04/18/2015 4:58:54 PM By: Curtis Sites Entered By: Curtis Sites on 04/18/2015 09:27:39 Kimmons, Lainie S. ([id number]) Lorton, Itsel S. ([id number]) -------------------------------------------------------------------------------- Nutrition Risk Assessment Details Patient Name: Stacie Mile S. 04/18/2015 8:45 Date of Service: AM Medical Record [id number] Number: Patient Account Number: [id number] 09-Nov-1940 (74 y.o. Treating RN: Curtis Sites Date of Birth/Sex: Female) Other Clinician: Primary Care Physician: Yetta Flock Referring Physician: Payton Mccallum Physician/Extender: Weeks in Treatment: 0 Height (in): Weight (lbs): Body Mass Index (BMI): Nutrition Risk Assessment Items NUTRITION RISK SCREEN: I have an illness or condition that made me change the kind and/or 0 No amount of food I eat I eat fewer than two meals per day 0 No I eat few fruits and vegetables, or milk products 0 No I have three or more drinks of beer, liquor or wine almost every day 0 No I have tooth or mouth problems that make it hard for me to eat 0 No I don't always have enough money to buy the food I need 0 No I eat alone most of the time 0 No I take three or more different prescribed or over-the-counter drugs a 1 Yes day Without wanting to, I have lost or gained 10 pounds in the last six 0 No months I am not always physically able to shop, cook and/or feed myself 0 No Nutrition Protocols Good Risk Protocol 0 No interventions needed Moderate Risk Protocol Electronic Signature(s) Signed: 04/18/2015 4:58:54 PM By: Curtis Sites Entered By: Curtis Sites on 04/18/2015 09:06:21

## 2015-04-20 NOTE — Progress Notes (Signed)
TEIGEN, PARSLOW ([id number]) Visit Report for 04/18/2015 Chief Complaint Document Details Patient Name: [name], [name] 04/18/2015 8:45 Date of Service: AM Medical Record [id number] Number: Patient Account Number: [id number] 1940/08/23 (74 y.o. Treating RN: Curtis Sites Date of Birth/Sex: Female) Other Clinician: Primary Care Physician: Yetta Flock Referring Physician: Payton Mccallum Physician/Extender: Weeks in Treatment: 0 Information Obtained from: Caregiver Chief Complaint Wound on the left second toe due to deformed foot causing pressure for about 2 weeks. Electronic Signature(s) Signed: 04/18/2015 9:48:12 AM By: Evlyn Kanner MD, FACS Previous Signature: 04/18/2015 9:43:29 AM Version By: Evlyn Kanner MD, FACS Entered By: Evlyn Kanner on 04/18/2015 09:48:12 [name] ([id number]) -------------------------------------------------------------------------------- HPI Details Patient Name: [name] 04/18/2015 8:45 Date of Service: AM Medical Record [id number] Number: Patient Account Number: [id number] 1940/10/26 (73 y.o. Treating RN: Curtis Sites Date of Birth/Sex: Female) Other Clinician: Primary Care Physician: Yetta Flock Referring Physician: Payton Mccallum Physician/Extender: Weeks in Treatment: 0 History of Present Illness Location: left second toe dorsum Quality: Patient reports No Pain. Severity: Patient states wound (s) are getting better. Duration: Patient has had the wound for < 2 weeks prior to presenting for treatment Timing: Pain in wound is Intermittent (comes and goes Context: The wound appeared gradually over time Modifying Factors: Consults to this date include: has been on Keflex for about 10 days Associated Signs and Symptoms: is wheelchair-bound HPI Description: 74 year old patient who has a history of spina bifida, cognitive disability was seen by Dr. Filbert Schilder  for a left second toe ulceration about 2 weeks ago. The patient has a past medical history of spina bifida, seizures, schizophrenia, depression, mental retardation. She has never been a smoker and has had no surgery. x-rays of the left foot were done which showed soft tissue swelling but no erosion involving the phalanx. With a diagnosis of cellulitis of the toe of the left foot the patient was placed on Keflex which was started on 04/09/2015 Electronic Signature(s) Signed: 04/18/2015 9:48:28 AM By: Evlyn Kanner MD, FACS Previous Signature: 04/18/2015 9:44:22 AM Version By: Evlyn Kanner MD, FACS Previous Signature: 04/18/2015 9:10:54 AM Version By: Evlyn Kanner MD, FACS Entered By: Evlyn Kanner on 04/18/2015 09:48:28 [name] ([id number]) -------------------------------------------------------------------------------- Physical Exam Details Patient Name: [name] 04/18/2015 8:45 Date of Service: AM Medical Record [id number] Number: Patient Account Number: [id number] 1941-02-18 (73 y.o. Treating RN: Curtis Sites Date of Birth/Sex: Female) Other Clinician: Primary Care Physician: Yetta Flock Referring Physician: Payton Mccallum Physician/Extender: Weeks in Treatment: 0 Constitutional . Pulse regular. Respirations normal and unlabored. Afebrile. . Eyes Nonicteric. Reactive to light. Ears, Nose, Mouth, and Throat Lips, teeth, and gums WNL.Marland Kitchen Moist mucosa without lesions . Neck supple and nontender. No palpable supraclavicular or cervical adenopathy. Normal sized without goiter. Respiratory WNL. No retractions.. Cardiovascular Pedal Pulses WNL. AB on the left is 0.94. No clubbing, cyanosis or edema. Lymphatic No adneopathy. No adenopathy. No adenopathy. Musculoskeletal Adexa without tenderness or enlargement.. Digits and nails w/o clubbing, cyanosis, infection, petechiae, ischemia, or inflammatory conditions.. Integumentary (Hair,  Skin) No suspicious lesions. No crepitus or fluctuance. No peri-wound warmth or erythema. No masses.Marland Kitchen Psychiatric Judgement and insight Intact.. No evidence of depression, anxiety, or agitation.. Notes the patient has significant deformity of the foot causing the left big toe to call over the left second toe and causing significant problems developed a large nail on the left big toe. After cleaning out the wound with saline the ulcer is fairly deep  and is a stage III pressure ulcer. There is no surrounding cellulitis. Electronic Signature(s) Signed: 04/18/2015 9:46:30 AM By: Evlyn Kanner MD, FACS Entered By: Evlyn Kanner on 04/18/2015 09:46:30 [name] ([id number]) -------------------------------------------------------------------------------- Physician Orders Details Patient Name: [name] 04/18/2015 8:45 Date of Service: AM Medical Record [id number] Number: Patient Account Number: [id number] 31-Oct-1940 (73 y.o. Treating RN: Huel Coventry Date of Birth/Sex: Female) Other Clinician: Primary Care Physician: Maudie Flakes Treating Evlyn Kanner Referring Physician: Payton Mccallum Physician/Extender: Tania Ade in Treatment: 0 Verbal / Phone Orders: Yes Clinician: Huel Coventry Read Back and Verified: Yes Diagnosis Coding Wound Cleansing Wound #1 Left Toe Second o Clean wound with Normal Saline. Anesthetic Wound #1 Left Toe Second o Topical Lidocaine 4% cream applied to wound bed prior to debridement Primary Wound Dressing Wound #1 Left Toe Second o Prisma Ag Secondary Dressing Wound #1 Left Toe Second o Gauze and Kerlix/Conform Dressing Change Frequency Wound #1 Left Toe Second o Change dressing every other day. Follow-up Appointments Wound #1 Left Toe Second o Return Appointment in 2 weeks. Consults o Podiatry - See Podiatry ASAP for nail trim. ROBINA, HAMOR ([id number]) Electronic Signature(s) Signed: 04/18/2015 4:36:30 PM By:  Evlyn Kanner MD, FACS Signed: 04/19/2015 5:33:23 PM By: Elliot Gurney RN, BSN, Kim RN, BSN Entered By: Elliot Gurney, RN, BSN, Kim on 04/18/2015 09:37:57 Fouse, Alaya Kathie Rhodes ([id number]) -------------------------------------------------------------------------------- Problem List Details Patient Name: [name] 04/18/2015 8:45 Date of Service: AM Medical Record [id number] Number: Patient Account Number: [id number] [birth year] (73 y.o. Treating RN: Curtis Sites Date of Birth/Sex: Female) Other Clinician: Primary Care Physician: Yetta Flock Referring Physician: Payton Mccallum Physician/Extender: Tania Ade in Treatment: 0 Active Problems ICD-10 Encounter Code Description Active Date Diagnosis L89.893 Pressure ulcer of other site, stage 3 04/18/2015 Yes S91.105A Unspecified open wound of left lesser toe(s) without 04/18/2015 Yes damage to nail, initial encounter Q05.9 Spina bifida, unspecified 04/18/2015 Yes F99 Mental disorder, not otherwise specified 04/18/2015 Yes Inactive Problems Resolved Problems Electronic Signature(s) Signed: 04/18/2015 9:47:56 AM By: Evlyn Kanner MD, FACS Previous Signature: 04/18/2015 9:47:47 AM Version By: Evlyn Kanner MD, FACS Previous Signature: 04/18/2015 9:43:00 AM Version By: Evlyn Kanner MD, FACS Entered By: Evlyn Kanner on 04/18/2015 09:47:56 Tatlock, Darcey Kathie Rhodes ([id number]) -------------------------------------------------------------------------------- Progress Note Details Patient Name: [name] 04/18/2015 8:45 Date of Service: AM Medical Record [id number] Number: Patient Account Number: [id number] 01/04/1941 (73 y.o. Treating RN: Curtis Sites Date of Birth/Sex: Female) Other Clinician: Primary Care Physician: Yetta Flock Referring Physician: Payton Mccallum Physician/Extender: Tania Ade in Treatment: 0 Subjective Chief Complaint Information obtained from Caregiver Wound on the  left second toe due to deformed foot causing pressure for about 2 weeks. History of Present Illness (HPI) The following HPI elements were documented for the patient's wound: Location: left second toe dorsum Quality: Patient reports No Pain. Severity: Patient states wound (s) are getting better. Duration: Patient has had the wound for < 2 weeks prior to presenting for treatment Timing: Pain in wound is Intermittent (comes and goes Context: The wound appeared gradually over time Modifying Factors: Consults to this date include: has been on Keflex for about 10 days Associated Signs and Symptoms: is wheelchair-bound 74 year old patient who has a history of spina bifida, cognitive disability was seen by Dr. Filbert Schilder for a left second toe ulceration about 2 weeks ago. The patient has a past medical history of spina bifida, seizures, schizophrenia, depression, mental retardation. She has never been a smoker and has had no surgery. x-rays of  the left foot were done which showed soft tissue swelling but no erosion involving the phalanx. With a diagnosis of cellulitis of the toe of the left foot the patient was placed on Keflex which was started on 04/09/2015 Wound History Patient presents with 1 open wound that has been present for approximately 2 weeks ago. Patient has been treating wound in the following manner: bacitracin and dry gauze wrapping, keflex. Laboratory tests have not been performed in the last month. Patient reportedly has not tested positive for an antibiotic resistant organism. Patient reportedly has not tested positive for osteomyelitis. Patient reportedly has not had testing performed to evaluate circulation in the legs. Patient History Information obtained from Caregiver. Allergies Tegretol, Sulfa (Sulfonamide Antibiotics) Canal, Nishi S. ([id number]) Social History Never smoker, Marital Status - Single, Alcohol Use - Never, Drug Use - No History, Caffeine Use -  Never. Medical History Neurologic Patient has history of Seizure Disorder Oncologic Denies history of Received Chemotherapy, Received Radiation Medical And Surgical History Notes Ear/Nose/Mouth/Throat pureed diet Musculoskeletal spinal bifida Neurologic mental retardation Psychiatric schizophrenia, depression Review of Systems (ROS) Constitutional Symptoms (General Health) The patient has no complaints or symptoms. Eyes The patient has no complaints or symptoms. Ear/Nose/Mouth/Throat The patient has no complaints or symptoms. Hematologic/Lymphatic The patient has no complaints or symptoms. Respiratory The patient has no complaints or symptoms. Cardiovascular The patient has no complaints or symptoms. Gastrointestinal The patient has no complaints or symptoms. Endocrine Complains or has symptoms of Thyroid disease - hypothyroid. Genitourinary Complains or has symptoms of Incontinence/dribbling - bowel and bladder. Immunological The patient has no complaints or symptoms. Integumentary (Skin) The patient has no complaints or symptoms. Oncologic The patient has no complaints or symptoms. Medications Relph, Lawren S. ([id number]) Balmex topical ointment topical colace oral syrup oral Duke's mouthwash oral liquid oral for one teaspoon three times daily as needed DuoDERM CGF Extra Thin Remeron 15 mg tablet oral tablet oral aspirin 81 mg tablet,delayed release oral 1 1 tablet,delayed release (DR/EC) oral Tylenol 325 mg tablet oral 2 2 tablet oral Tylenol Extra Strength 500 mg tablet oral 1 1 tablet oral Ativan 1 mg tablet oral 1 1 tablet oral Keppra 250 mg tablet oral 1 1 tablet oral Lamictal 100 mg tablet oral 1 1 tablet oral Zyrtec 10 mg tablet oral 1 1 tablet oral olanzapine 2.5 mg tablet oral 1 1 tablet oral olanzapine 5 mg tablet oral 1 1 tablet oral Calcium 600 + D(3) 600 mg (1,500 mg)-400 unit tablet oral 1 1 tablet oral Debrox 6.5 % ear drops otic drops  otic Lubriderm Daily Moisture lotion topical lotion topical folic acid 1 mg tablet oral 1 1 tablet oral Zantac 150 mg tablet oral 1 1 tablet oral Dulcolax (bisacodyl) 10 mg rectal suppository rectal 1 1 suppository rectal Synthroid 25 mcg tablet oral tablet oral bacitracin 500 unit/gram topical ointment topical ointment topical Objective Constitutional Pulse regular. Respirations normal and unlabored. Afebrile. Vitals Time Taken: 9:05 AM, Weight: 125 lbs, Source: Stated, Temperature: 97.7 F, Pulse: 59 bpm, Respiratory Rate: 18 breaths/min, Blood Pressure: 148/59 mmHg. Eyes Nonicteric. Reactive to light. Ears, Nose, Mouth, and Throat Lips, teeth, and gums WNL.Marland Kitchen Moist mucosa without lesions . Neck supple and nontender. No palpable supraclavicular or cervical adenopathy. Normal sized without goiter. Respiratory WNL. No retractions.. Cardiovascular Pedal Pulses WNL. AB on the left is 0.94. No clubbing, cyanosis or edema. Bloyd, Hebe S. ([id number]) Lymphatic No adneopathy. No adenopathy. No adenopathy. Musculoskeletal Adexa without tenderness or enlargement.. Digits and nails w/o clubbing,  cyanosis, infection, petechiae, ischemia, or inflammatory conditions.Marland Kitchen Psychiatric Judgement and insight Intact.. No evidence of depression, anxiety, or agitation.. General Notes: the patient has significant deformity of the foot causing the left big toe to call over the left second toe and causing significant problems developed a large nail on the left big toe. After cleaning out the wound with saline the ulcer is fairly deep and is a stage III pressure ulcer. There is no surrounding cellulitis. Integumentary (Hair, Skin) No suspicious lesions. No crepitus or fluctuance. No peri-wound warmth or erythema. No masses.. Wound #1 status is Open. Original cause of wound was Gradually Appeared. The wound is located on the Left Toe Second. The wound measures 0.6cm length x 0.4cm width x 0.2cm depth;  0.188cm^2 area and 0.038cm^3 volume. The wound is limited to skin breakdown. There is no tunneling or undermining noted. There is a medium amount of serous drainage noted. The wound margin is flat and intact. There is medium (34-66%) pink granulation within the wound bed. There is a medium (34-66%) amount of necrotic tissue within the wound bed including Eschar and Adherent Slough. The periwound skin appearance exhibited: Moist. The periwound skin appearance did not exhibit: Callus, Crepitus, Excoriation, Fluctuance, Friable, Induration, Localized Edema, Rash, Scarring, Dry/Scaly, Maceration, Atrophie Blanche, Cyanosis, Ecchymosis, Hemosiderin Staining, Mottled, Pallor, Rubor, Erythema. Periwound temperature was noted as No Abnormality. The periwound has tenderness on palpation. Assessment Active Problems ICD-10 L89.893 - Pressure ulcer of other site, stage 3 S91.105A - Unspecified open wound of left lesser toe(s) without damage to nail, initial encounter Q05.9 - Spina bifida, unspecified F99 - Mental disorder, not otherwise specified Khun, Denee S. ([id number]) The patient has a large curved left big toe which goes on medially and is causing pressure due today an uncut nail which is almost like a claw. This needs to be cut by a podiatrist. We have requested the caregivers to take her to a podiatrist as soon as possible. I would recommend Prisma and a local dressing to be changed every day alternate days as desired. Offloading is going to be difficult because of the patient's mental status but we will try her best. he will be seen back on a regular basis. Plan Wound Cleansing: Wound #1 Left Toe Second: Clean wound with Normal Saline. Anesthetic: Wound #1 Left Toe Second: Topical Lidocaine 4% cream applied to wound bed prior to debridement Primary Wound Dressing: Wound #1 Left Toe Second: Prisma Ag Secondary Dressing: Wound #1 Left Toe Second: Gauze and Kerlix/Conform Dressing  Change Frequency: Wound #1 Left Toe Second: Change dressing every other day. Follow-up Appointments: Wound #1 Left Toe Second: Return Appointment in 2 weeks. Consults ordered were: Podiatry - See Podiatry ASAP for nail trim. The patient has a large curved left big toe which goes on medially and is causing pressure due today an uncut nail which is almost like a claw. This needs to be cut by a podiatrist. We have requested the caregivers to take her to a podiatrist as soon as possible. I would recommend Prisma and a local dressing to be changed every day alternate days as desired. Offloading is going to be difficult because of the patient's mental status but we will try her best. he will be seen back on a regular basis. Electronic Signature(s) LELER, BRION ([id number]) Signed: 04/18/2015 9:50:31 AM By: Evlyn Kanner MD, FACS Entered By: Evlyn Kanner on 04/18/2015 09:50:31 [name] ([id number]) -------------------------------------------------------------------------------- ROS/PFSH Details Patient Name: [name] 04/18/2015 8:45 Date of Service: AM  Medical Record [id number] Number: Patient Account Number: [id number] 12/03/1940 (73 y.o. Treating RN: Curtis Sites Date of Birth/Sex: Female) Other Clinician: Primary Care Physician: Yetta Flock Referring Physician: Payton Mccallum Physician/Extender: Weeks in Treatment: 0 Information Obtained From Caregiver Wound History Do you currently have one or more open woundso Yes How many open wounds do you currently haveo 1 Approximately how long have you had your woundso 2 weeks ago How have you been treating your wound(s) until nowo bacitracin and dry gauze wrapping, keflex Has your wound(s) ever healed and then re-openedo No Have you had any lab work done in the past montho No Have you tested positive for an antibiotic resistant organism No (MRSA, VRE)o Have you tested positive  for osteomyelitis (bone infection)o No Have you had any tests for circulation on your legso No Endocrine Complaints and Symptoms: Positive for: Thyroid disease - hypothyroid Genitourinary Complaints and Symptoms: Positive for: Incontinence/dribbling - bowel and bladder Constitutional Symptoms (General Health) Complaints and Symptoms: No Complaints or Symptoms Eyes Complaints and Symptoms: No Complaints or Symptoms Ear/Nose/Mouth/Throat Complaints and Symptoms: No Complaints or Symptoms Medical HistoryLIANNE, CARRETO ([id number]) Past Medical History Notes: pureed diet Hematologic/Lymphatic Complaints and Symptoms: No Complaints or Symptoms Respiratory Complaints and Symptoms: No Complaints or Symptoms Cardiovascular Complaints and Symptoms: No Complaints or Symptoms Gastrointestinal Complaints and Symptoms: No Complaints or Symptoms Immunological Complaints and Symptoms: No Complaints or Symptoms Integumentary (Skin) Complaints and Symptoms: No Complaints or Symptoms Musculoskeletal Medical History: Past Medical History Notes: spinal bifida Neurologic Medical History: Positive for: Seizure Disorder Past Medical History Notes: mental retardation Oncologic Complaints and Symptoms: No Complaints or Symptoms Medical History: Schrecengost, Josselin S. ([id number]) Negative for: Received Chemotherapy; Received Radiation Psychiatric Medical History: Past Medical History Notes: schizophrenia, depression Family and Social History Never smoker; Marital Status - Single; Alcohol Use: Never; Drug Use: No History; Caffeine Use: Never; Financial Concerns: No; Food, Clothing or Shelter Needs: No; Support System Lacking: No; Transportation Concerns: No; Advanced Directives: No; Patient does not want information on Advanced Directives Physician Affirmation I have reviewed and agree with the above information. Electronic Signature(s) Signed: 04/18/2015 9:48:45 AM By:  Evlyn Kanner MD, FACS Signed: 04/18/2015 4:58:54 PM By: Curtis Sites Entered By: Evlyn Kanner on 04/18/2015 09:48:44 Corzine, Jazmyn S. ([id number]) -------------------------------------------------------------------------------- SuperBill Details Patient Name: [name] Date of Service: 04/18/2015 Medical Record Number: [id number] Patient Account Number: [id number] Date of Birth/Sex: 07/07/1941 (74 y.o. Female) Treating RN: Curtis Sites Primary Care Physician: Maudie Flakes Other Clinician: Referring Physician: Payton Mccallum Treating Physician/Extender: Rudene Re in Treatment: 0 Diagnosis Coding ICD-10 Codes Code Description [phone] Pressure ulcer of other site, stage 3 S91.105A Unspecified open wound of left lesser toe(s) without damage to nail, initial encounter Q05.9 Spina bifida, unspecified F99 Mental disorder, not otherwise specified Facility Procedures CPT4 Code: [id number] Description: 99214 - WOUND CARE VISIT-LEV 4 EST PT Modifier: Quantity: 1 Physician Procedures CPT4: Description Modifier Quantity Code [id number] 99214 - WC PHYS LEVEL 4 - EST PT 1 ICD-10 Description Diagnosis L89.893 Pressure ulcer of other site, stage 3 S91.105A Unspecified open wound of left lesser toe(s) without damage to nail, initial encounter  Q05.9 Spina bifida, unspecified F99 Mental disorder, not otherwise specified Electronic Signature(s) Signed: 04/18/2015 9:50:55 AM By: Evlyn Kanner MD, FACS Entered By: Evlyn Kanner on 04/18/2015 09:50:55

## 2015-04-20 NOTE — Progress Notes (Signed)
KIARRAH, RAUSCH ([id number]) Visit Report for 04/18/2015 Allergy List Details Patient Name: [name], [name] Date of Service: 04/18/2015 8:45 AM Medical Record Number: [id number] Patient Account Number: [id number] Date of Birth/Sex: 11-30-1940 (74 y.o. Female) Treating RN: Curtis Sites Primary Care Physician: Maudie Flakes Other Clinician: Referring Physician: Payton Mccallum Treating Physician/Extender: Rudene Re in Treatment: 0 Allergies Active Allergies Tegretol Sulfa (Sulfonamide Antibiotics) Allergy Notes Electronic Signature(s) Signed: 04/18/2015 4:58:54 PM By: Curtis Sites Entered By: Curtis Sites on 04/18/2015 08:56:33 Trani, Miller S. ([id number]) -------------------------------------------------------------------------------- Arrival Information Details Patient Name: [name] Date of Service: 04/18/2015 8:45 AM Medical Record Number: [id number] Patient Account Number: [id number] Date of Birth/Sex: Dec 04, 1940 (74 y.o. Female) Treating RN: Curtis Sites Primary Care Physician: Maudie Flakes Other Clinician: Referring Physician: Payton Mccallum Treating Physician/Extender: Rudene Re in Treatment: 0 Visit Information Patient Arrived: Wheel Chair Arrival Time: 08:53 Accompanied By: staff Transfer Assistance: Manual Patient Identification Verified: Yes Secondary Verification Process Yes Completed: Electronic Signature(s) Signed: 04/18/2015 4:58:54 PM By: Curtis Sites Entered By: Curtis Sites on 04/18/2015 08:55:29 [name], [name] ([id number]) -------------------------------------------------------------------------------- Clinic Level of Care Assessment Details Patient Name: [name], [name] Date of Service: 04/18/2015 8:45 AM Medical Record Number: [id number] Patient Account Number: [id number] Date of Birth/Sex: August 30, 1940 (74 y.o. Female) Treating RN: Huel Coventry Primary Care Physician: Maudie Flakes Other Clinician: Referring Physician: Payton Mccallum Treating Physician/Extender: Rudene Re in Treatment: 0 Clinic Level of Care Assessment Items TOOL 2 Quantity Score []  - Use when only an EandM is performed on the INITIAL visit 0 ASSESSMENTS - Nursing Assessment / Reassessment X - General Physical Exam (combine w/ comprehensive assessment (listed just 1 20 below) when performed on new pt. evals) X - Comprehensive Assessment (HX, ROS, Risk Assessments, Wounds Hx, etc.) 1 25 ASSESSMENTS - Wound and Skin Assessment / Reassessment X - Simple Wound Assessment / Reassessment - one wound 1 5 []  - Complex Wound Assessment / Reassessment - multiple wounds 0 []  - Dermatologic / Skin Assessment (not related to wound area) 0 ASSESSMENTS - Ostomy and/or Continence Assessment and Care []  - Incontinence Assessment and Management 0 []  - Ostomy Care Assessment and Management (repouching, etc.) 0 PROCESS - Coordination of Care X - Simple Patient / Family Education for ongoing care 1 15 []  - Complex (extensive) Patient / Family Education for ongoing care 0 X - Staff obtains Consents, Records, Test Results / Process Orders 1 10 []  - Staff telephones HHA, Nursing Homes / Clarify orders / etc 0 []  - Routine Transfer to another Facility (non-emergent condition) 0 []  - Routine Hospital Admission (non-emergent condition) 0 []  - New Admissions / Manufacturing engineer / Ordering NPWT, Apligraf, etc. 0 []  - Emergency Hospital Admission (emergent condition) 0 X - Simple Discharge Coordination 1 10 [name], [name] ([id number]) []  - Complex (extensive) Discharge Coordination 0 PROCESS - Special Needs []  - Pediatric / Minor Patient Management 0 []  - Isolation Patient Management 0 []  - Hearing / Language / Visual special needs 0 []  - Assessment of Community assistance (transportation, D/C planning, etc.) 0 []  - Additional assistance / Altered mentation 0 []  - Support Surface(s) Assessment  (bed, cushion, seat, etc.) 0 INTERVENTIONS - Wound Cleansing / Measurement X - Wound Imaging (photographs - any number of wounds) 1 5 []  - Wound Tracing (instead of photographs) 0 X - Simple Wound Measurement - one wound 1 5 []  - Complex Wound Measurement - multiple wounds 0 X - Simple Wound Cleansing - one wound 1 5 []  -  Complex Wound Cleansing - multiple wounds 0 INTERVENTIONS - Wound Dressings X - Small Wound Dressing one or multiple wounds 1 10  - Medium Wound Dressing one or multiple wounds 0  - Large Wound Dressing one or multiple wounds 0  - Application of Medications - injection 0 INTERVENTIONS - Miscellaneous  - External ear exam 0  - Specimen Collection (cultures, biopsies, blood, body fluids, etc.) 0  - Specimen(s) / Culture(s) sent or taken to Lab for analysis 0  - Patient Transfer (multiple staff / Nurse, adult / Similar devices) 0  - Simple Staple / Suture removal (25 or less) 0  - Complex Staple / Suture removal (26 or more) 0 [name], [name] ([id number])  - Hypo / Hyperglycemic Management (close monitor of Blood Glucose) 0 X - Ankle / Brachial Index (ABI) - do not check if billed separately 1 15 Has the patient been seen at the hospital within the last three years: Yes Total Score: 125 Level Of Care: New/Established - Level 4 Electronic Signature(s) Signed: 04/19/2015 5:33:23 PM By: Elliot Gurney, RN, BSN, Kim RN, BSN Entered By: Elliot Gurney, RN, BSN, Kim on 04/18/2015 09:41:35 [name], [name] ([id number]) -------------------------------------------------------------------------------- Encounter Discharge Information Details Patient Name: [name], Tu S. Date of Service: 04/18/2015 8:45 AM Medical Record Number: [id number] Patient Account Number: [id number] Date of Birth/Sex: 04-02-1941 (74 y.o. Female) Treating RN: Curtis Sites Primary Care Physician: Maudie Flakes Other Clinician: Referring Physician: Payton Mccallum Treating  Physician/Extender: Rudene Re in Treatment: 0 Encounter Discharge Information Items Discharge Pain Level: 0 Discharge Condition: Stable Ambulatory Status: Wheelchair Discharge Destination: Home Transportation: Private Auto Accompanied By: staff Schedule Follow-up Appointment: Yes Medication Reconciliation completed and provided to Patient/Care No Tareek Sabo: Provided on Clinical Summary of Care: 04/18/2015 Form Type Recipient Paper Patient RB Electronic Signature(s) Signed: 04/18/2015 12:41:53 PM By: Curtis Sites Previous Signature: 04/18/2015 9:50:31 AM Version By: Gwenlyn Perking Entered By: Curtis Sites on 04/18/2015 12:41:53 [name], [name] ([id number]) -------------------------------------------------------------------------------- Lower Extremity Assessment Details Patient Name: [name], Lekita S. Date of Service: 04/18/2015 8:45 AM Medical Record Number: [id number] Patient Account Number: [id number] Date of Birth/Sex: [birth year] (74 y.o. Female) Treating RN: Curtis Sites Primary Care Physician: Maudie Flakes Other Clinician: Referring Physician: Payton Mccallum Treating Physician/Extender: Rudene Re in Treatment: 0 Edema Assessment Assessed: Kyra Searles: No] [Right: No] Edema: [Left: No] [Right: No] Calf Left: Right: Point of Measurement: 8 cm From Medial Instep 23 cm 23.6 cm Ankle Left: Right: Point of Measurement: 23 cm From Medial Instep 18.7 cm 20 cm Vascular Assessment Pulses: Posterior Tibial Palpable: [Left:Yes] [Right:No] Doppler: [Left:Monophasic] Dorsalis Pedis Palpable: [Left:Yes] [Right:Yes] Doppler: [Left:Multiphasic] Extremity colors, hair growth, and conditions: Extremity Color: [Left:Normal] [Right:Normal] Hair Growth on Extremity: [Left:Yes] [Right:Yes] Temperature of Extremity: [Left:Warm] [Right:Warm] Capillary Refill: [Left:< 3 seconds] [Right:< 3 seconds] Blood Pressure: Brachial: [Left:142] Dorsalis Pedis: 134  [Left:Dorsalis Pedis:] Ankle: Posterior Tibial: [Left:Posterior Tibial: 0.94] Toe Nail Assessment Left: Right: Thick: Yes Yes Discolored: Yes Yes Deformed: Yes Yes Improper Length and Hygiene: No No Enfield, Tamora S. 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 ([id number]) -------------------------------------------------------------------------------- Multi Wound Chart Details Patient Name: [name], [name] Date of Service: 04/18/2015 8:45 AM Medical Record Number: [id number] Patient Account Number: [id number] Date of Birth/Sex: 08/12/1940 (74 y.o. Female) Treating RN: Huel Coventry Primary Care Physician: Maudie Flakes Other Clinician: Referring Physician: Payton Mccallum Treating Physician/Extender:  Britto, Errol Weeks in Treatment: 0 Vital Signs Height(in): Pulse(bpm): 59 Weight(lbs): 125 Blood Pressure 148/59 (mmHg): Body Mass Index(BMI): Temperature(F): 97.7 Respiratory Rate 18 (breaths/min): Photos: [1:No Photos] [N/A:N/A] Wound Location: [1:Left Toe Second] [N/A:N/A] Wounding Event: [1:Gradually Appeared] [N/A:N/A] Primary Etiology: [1:Pressure Ulcer] [N/A:N/A] Comorbid History: [1:Seizure Disorder] [N/A:N/A] Date Acquired: [1:03/28/2015] [N/A:N/A] Weeks of Treatment: [1:0] [N/A:N/A] Wound Status: [1:Open] [N/A:N/A] Measurements L x W x D 0.6x0.4x0.2 [N/A:N/A] (cm) Area (cm) : [1:0.188] [N/A:N/A] Volume (cm) : [1:0.038] [N/A:N/A] % Reduction in Area: [1:0.00%] [N/A:N/A] % Reduction in Volume: 0.00% [N/A:N/A] Classification: [1:Category/Stage III] [N/A:N/A] Exudate Amount: [1:Medium] [N/A:N/A] Exudate Type: [1:Serous] [N/A:N/A] Exudate Color: [1:amber] [N/A:N/A] Wound Margin: [1:Flat and Intact] [N/A:N/A] Granulation Amount: [1:Medium (34-66%)]  [N/A:N/A] Granulation Quality: [1:Pink] [N/A:N/A] Necrotic Amount: [1:Medium (34-66%)] [N/A:N/A] Necrotic Tissue: [1:Eschar, Adherent Slough] [N/A:N/A] Exposed Structures: [1:Fascia: No Fat: No Tendon: No Muscle: No Joint: No Bone: No] [N/A:N/A] Limited to Skin Breakdown Epithelialization: None N/A N/A Periwound Skin Texture: Edema: No N/A N/A Excoriation: No Induration: No Callus: No Crepitus: No Fluctuance: No Friable: No Rash: No Scarring: No Periwound Skin Moist: Yes N/A N/A Moisture: Maceration: No Dry/Scaly: No Periwound Skin Color: Atrophie Blanche: No N/A N/A Cyanosis: No Ecchymosis: No Erythema: No Hemosiderin Staining: No Mottled: No Pallor: No Rubor: No Temperature: No Abnormality N/A N/A Tenderness on Yes N/A N/A Palpation: Wound Preparation: Ulcer Cleansing: N/A N/A Rinsed/Irrigated with Saline Topical Anesthetic Applied: Other: lidocaine 4% Treatment Notes Electronic Signature(s) Signed: 04/19/2015 5:33:23 PM By: Elliot Gurney, RN, BSN, Kim RN, BSN Entered By: Elliot Gurney, RN, BSN, Kim on 04/18/2015 16:10:96 [name] ([id number]) -------------------------------------------------------------------------------- Multi-Disciplinary Care Plan Details Patient Name: [name], [name] Date of Service: 04/18/2015 8:45 AM Medical Record Number: [id number] Patient Account Number: [id number] Date of Birth/Sex: June 09, 1941 (74 y.o. Female) Treating RN: Huel Coventry Primary Care Physician: Maudie Flakes Other Clinician: Referring Physician: Payton Mccallum Treating Physician/Extender: Rudene Re in Treatment: 0 Active Inactive Abuse / Safety / Falls / Self Care Management Nursing Diagnoses: Impaired physical mobility Goals: Patient will remain injury free Date Initiated: 04/18/2015 Goal Status: Active Interventions: Assess fall risk on admission and as needed Notes: Orientation to the Wound Care Program Nursing Diagnoses: Knowledge deficit  related to the wound healing center program Goals: Patient/caregiver will verbalize understanding of the Wound Healing Center Program Date Initiated: 04/18/2015 Goal Status: Active Interventions: Provide education on orientation to the wound center Notes: Pressure Nursing Diagnoses: Knowledge deficit related to causes and risk factors for pressure ulcer development Goals: Patient will remain free from development of additional pressure ulcers Date Initiated: 04/18/2015 [name], PROBUS. ([id number]) Goal Status: Active Interventions: Provide education on pressure ulcers Notes: Wound/Skin Impairment Nursing Diagnoses: Impaired tissue integrity Goals: Ulcer/skin breakdown will heal within 14 weeks Date Initiated: 04/18/2015 Goal Status: Active Interventions: Assess ulceration(s) every visit Notes: Electronic Signature(s) Signed: 04/19/2015 5:33:23 PM By: Elliot Gurney, RN, BSN, Kim RN, BSN Entered By: Elliot Gurney, RN, BSN, Kim on 04/18/2015 09:36:09 [name], Stacie SMarland Kitchen ([id number]) -------------------------------------------------------------------------------- Patient/Caregiver Education Details Patient Name: [name] Date of Service: 04/18/2015 8:45 AM Medical Record Number: [id number] Patient Account Number: [id number] Date of Birth/Gender: 02-Mar-1941 (73 y.o. Female) Treating RN: Curtis Sites Primary Care Physician: Maudie Flakes Other Clinician: Referring Physician: Payton Mccallum Treating Physician/Extender: Rudene Re in Treatment: 0 Education Assessment Education Provided To: Caregiver Education Topics Provided Wound/Skin Impairment: Handouts: Other: wound care as ordered Methods: Demonstration, Explain/Verbal Responses: State content correctly Electronic Signature(s) Signed: 04/18/2015 12:42:09 PM By: Curtis Sites Entered By: Curtis Sites on 04/18/2015 12:42:09 Reffner, Cereniti  S.  ([id number]) -------------------------------------------------------------------------------- Wound Assessment Details Patient Name: COUSAR, Rosaly S. Date of Service: 04/18/2015 8:45 AM Medical Record Number: [id number] Patient Account Number: [id number] Date of Birth/Sex: [birth year] (74 y.o. Female) Treating RN: Curtis Sites Primary Care Physician: Maudie Flakes Other Clinician: Referring Physician: Payton Mccallum Treating Physician/Extender: Rudene Re in Treatment: 0 Wound Status Wound Number: 1 Primary Etiology: Pressure Ulcer Wound Location: Left Toe Second Wound Status: Open Wounding Event: Gradually Appeared Comorbid History: Seizure Disorder Date Acquired: 03/28/2015 Weeks Of Treatment: 0 Clustered Wound: No Photos Photo Uploaded By: Curtis Sites on 04/18/2015 10:02:00 Wound Measurements Length: (cm) 0.6 % Reduction in Width: (cm) 0.4 % Reduction in Depth: (cm) 0.2 Epithelializati Area: (cm) 0.188 Tunneling: Volume: (cm) 0.038 Undermining: Area: 0% Volume: 0% on: None No No Wound Description Classification: Category/Stage III Wound Margin: Flat and Intact Exudate Amount: Medium Exudate Type: Serous Exudate Color: amber Foul Odor After Cleansing: No Wound Bed Granulation Amount: Medium (34-66%) Exposed Structure Granulation Quality: Pink Fascia Exposed: No Necrotic Amount: Medium (34-66%) Fat Layer Exposed: No Necrotic Quality: Eschar, Adherent Slough Tendon Exposed: No Keckler, Kanda S. ([id number]) Muscle Exposed: No Joint Exposed: No Bone Exposed: No Limited to Skin Breakdown Periwound Skin Texture Texture Color No Abnormalities Noted: No No Abnormalities Noted: No Callus: No Atrophie Blanche: No Crepitus: No Cyanosis: No Excoriation: No Ecchymosis: No Fluctuance: No Erythema: No Friable: No Hemosiderin Staining: No Induration: No Mottled: No Localized Edema: No Pallor: No Rash: No Rubor: No Scarring: No  Temperature / Pain Moisture Temperature: No Abnormality No Abnormalities Noted: No Tenderness on Palpation: Yes Dry / Scaly: No Maceration: No Moist: Yes Wound Preparation Ulcer Cleansing: Rinsed/Irrigated with Saline Topical Anesthetic Applied: Other: lidocaine 4%, Treatment Notes Wound #1 (Left Toe Second) 1. Cleansed with: Clean wound with Normal Saline 2. Anesthetic Topical Lidocaine 4% cream to wound bed prior to debridement 4. Dressing Applied: Prisma Ag 5. Secondary Dressing Applied Gauze and Kerlix/Conform 7. Secured with Secretary/administrator) Signed: 04/18/2015 4:58:54 PM By: Curtis Sites Entered By: Curtis Sites on 04/18/2015 09:27:29 Rusconi, Shaundrea Kathie Rhodes ([id number]) -------------------------------------------------------------------------------- Vitals Details Patient Name: [name] Date of Service: 04/18/2015 8:45 AM Medical Record Number: [id number] Patient Account Number: [id number] Date of Birth/Sex: Jun 06, 1941 (74 y.o. Female) Treating RN: Curtis Sites Primary Care Physician: Maudie Flakes Other Clinician: Referring Physician: Payton Mccallum Treating Physician/Extender: Rudene Re in Treatment: 0 Vital Signs Time Taken: 09:05 Temperature (F): 97.7 Weight (lbs): 125 Pulse (bpm): 59 Source: Stated Respiratory Rate (breaths/min): 18 Blood Pressure (mmHg): 148/59 Reference Range: 80 - 120 mg / dl Electronic Signature(s) Signed: 04/18/2015 4:58:54 PM By: Curtis Sites Entered By: Curtis Sites on 04/18/2015 09:09:06

## 2015-04-25 ENCOUNTER — Encounter (HOSPITAL_BASED_OUTPATIENT_CLINIC_OR_DEPARTMENT_OTHER): Payer: Medicare Other | Admitting: General Surgery

## 2015-04-25 ENCOUNTER — Encounter: Payer: Self-pay | Admitting: General Surgery

## 2015-04-25 DIAGNOSIS — S91205A Unspecified open wound of left lesser toe(s) with damage to nail, initial encounter: Secondary | ICD-10-CM | POA: Insufficient documentation

## 2015-04-25 DIAGNOSIS — S91205D Unspecified open wound of left lesser toe(s) with damage to nail, subsequent encounter: Secondary | ICD-10-CM

## 2015-04-25 DIAGNOSIS — Q059 Spina bifida, unspecified: Secondary | ICD-10-CM | POA: Diagnosis not present

## 2015-04-25 DIAGNOSIS — S91105A Unspecified open wound of left lesser toe(s) without damage to nail, initial encounter: Secondary | ICD-10-CM

## 2015-04-25 DIAGNOSIS — L89893 Pressure ulcer of other site, stage 3: Secondary | ICD-10-CM

## 2015-04-25 DIAGNOSIS — F99 Mental disorder, not otherwise specified: Secondary | ICD-10-CM

## 2015-04-25 NOTE — Progress Notes (Signed)
seeiheal 

## 2015-04-26 NOTE — Progress Notes (Signed)
RAYNIE, STEINHAUS ([id number]) Visit Report for 04/25/2015 Arrival Information Details Patient Name: [name] 04/25/2015 11:30 Date of Service: AM Medical Record [id number] Number: Patient Account Number: [id number] Aug 07, 1940 (74 y.o. Treating RN: Curtis Sites Date of Birth/Sex: Female) Other Clinician: Primary Care Physician: Johnsie Cancel, PETER Referring Physician: Maudie Flakes Physician/Extender: Weeks in Treatment: 1 Visit Information History Since Last Visit Added or deleted any medications: No Patient Arrived: Wheel Chair Any new allergies or adverse reactions: No Arrival Time: 12:05 Had a fall or experienced change in No activities of daily living that may affect Accompanied By: staff risk of falls: Transfer Assistance: Manual Signs or symptoms of abuse/neglect since last No Patient Identification Verified: Yes visito Secondary Verification Process Yes Hospitalized since last visit: No Completed: Pain Present Now: No Electronic Signature(s) Signed: 04/25/2015 5:24:25 PM By: Curtis Sites Entered By: Curtis Sites on 04/25/2015 12:12:27 Brand, Octaviano Batty ([id number]) -------------------------------------------------------------------------------- Clinic Level of Care Assessment Details Patient Name: Stacie Mile S. 04/25/2015 11:30 Date of Service: AM Medical Record [id number] Number: Patient Account Number: [id number] July 10, 1941 (74 y.o. Treating RN: Curtis Sites Date of Birth/Sex: Female) Other Clinician: Primary Care Physician: Johnsie Cancel, PETER Referring Physician: Maudie Flakes Physician/Extender: Weeks in Treatment: 1 Clinic Level of Care Assessment Items TOOL 4 Quantity Score []  - Use when only an EandM is performed on FOLLOW-UP visit 0 ASSESSMENTS - Nursing Assessment / Reassessment X - Reassessment of Co-morbidities (includes updates in patient status) 1 10 X - Reassessment  of Adherence to Treatment Plan 1 5 ASSESSMENTS - Wound and Skin Assessment / Reassessment []  - Simple Wound Assessment / Reassessment - one wound 0 X - Complex Wound Assessment / Reassessment - multiple wounds 2 5 []  - Dermatologic / Skin Assessment (not related to wound area) 0 ASSESSMENTS - Focused Assessment []  - Circumferential Edema Measurements - multi extremities 0 []  - Nutritional Assessment / Counseling / Intervention 0 X - Lower Extremity Assessment (monofilament, tuning fork, pulses) 1 5 []  - Peripheral Arterial Disease Assessment (using hand held doppler) 0 ASSESSMENTS - Ostomy and/or Continence Assessment and Care []  - Incontinence Assessment and Management 0 []  - Ostomy Care Assessment and Management (repouching, etc.) 0 PROCESS - Coordination of Care X - Simple Patient / Family Education for ongoing care 1 15 []  - Complex (extensive) Patient / Family Education for ongoing care 0 []  - Staff obtains Chiropractor, Records, Test Results / Process Orders 0 []  - Staff telephones HHA, Nursing Homes / Clarify orders / etc 0 Chai, Neema S. ([id number]) []  - Routine Transfer to another Facility (non-emergent condition) 0 []  - Routine Hospital Admission (non-emergent condition) 0 []  - New Admissions / Manufacturing engineer / Ordering NPWT, Apligraf, etc. 0 []  - Emergency Hospital Admission (emergent condition) 0 X - Simple Discharge Coordination 1 10 []  - Complex (extensive) Discharge Coordination 0 PROCESS - Special Needs []  - Pediatric / Minor Patient Management 0 []  - Isolation Patient Management 0 []  - Hearing / Language / Visual special needs 0 []  - Assessment of Community assistance (transportation, D/C planning, etc.) 0 []  - Additional assistance / Altered mentation 0 []  - Support Surface(s) Assessment (bed, cushion, seat, etc.) 0 INTERVENTIONS - Wound Cleansing / Measurement []  - Simple Wound Cleansing - one wound 0 X - Complex Wound Cleansing - multiple wounds 2 5 X  - Wound Imaging (photographs - any number of wounds) 1 5 []  - Wound Tracing (instead of photographs) 0 []  - Simple Wound Measurement - one wound 0  X - Complex Wound Measurement - multiple wounds 2 5 INTERVENTIONS - Wound Dressings X - Small Wound Dressing one or multiple wounds 2 10  - Medium Wound Dressing one or multiple wounds 0  - Large Wound Dressing one or multiple wounds 0  - Application of Medications - topical 0  - Application of Medications - injection 0 Mountz, Matayah S. ([id number]) INTERVENTIONS - Miscellaneous  - External ear exam 0  - Specimen Collection (cultures, biopsies, blood, body fluids, etc.) 0  - Specimen(s) / Culture(s) sent or taken to Lab for analysis 0  - Patient Transfer (multiple staff / Michiel Sites Lift / Similar devices) 0  - Simple Staple / Suture removal (25 or less) 0  - Complex Staple / Suture removal (26 or more) 0  - Hypo / Hyperglycemic Management (close monitor of Blood Glucose) 0  - Ankle / Brachial Index (ABI) - do not check if billed separately 0 X - Vital Signs 1 5 Has the patient been seen at the hospital within the last three years: Yes Total Score: 105 Level Of Care: New/Established - Level 3 Electronic Signature(s) Signed: 04/25/2015 5:24:25 PM By: Curtis Sites Entered By: Curtis Sites on 04/25/2015 12:34:13 Pollinger, Tamaka Kathie Rhodes ([id number]) -------------------------------------------------------------------------------- Encounter Discharge Information Details Patient Name: Stacie Mile S. 04/25/2015 11:30 Date of Service: AM Medical Record [id number] Number: Patient Account Number: [id number] 04/10/1941 (74 y.o. Treating RN: Curtis Sites Date of Birth/Sex: Female) Other Clinician: Primary Care Physician: Johnsie Cancel, PETER Referring Physician: Maudie Flakes Physician/Extender: Weeks in Treatment: 1 Encounter Discharge Information Items Discharge Pain Level: 0 Discharge  Condition: Stable Ambulatory Status: Wheelchair Discharge Destination: Home Private Transportation: Auto Accompanied By: staff Schedule Follow-up Appointment: Yes Medication Reconciliation completed and No provided to Patient/Care Leronda Lewers: Clinical Summary of Care: Electronic Signature(s) Signed: 04/25/2015 5:36:04 PM By: Ardath Sax MD Entered By: Ardath Sax on 04/25/2015 12:38:45 [name] ([id number]) -------------------------------------------------------------------------------- Lower Extremity Assessment Details Patient Name: Stacie Mile S. 04/25/2015 11:30 Date of Service: AM Medical Record [id number] Number: Patient Account Number: [id number] 1941/07/13 (73 y.o. Treating RN: Curtis Sites Date of Birth/Sex: Female) Other Clinician: Primary Care Physician: Johnsie Cancel, PETER Referring Physician: Maudie Flakes Physician/Extender: Weeks in Treatment: 1 Vascular Assessment Pulses: Posterior Tibial Dorsalis Pedis Palpable: [Left:Yes] Extremity colors, hair growth, and conditions: Extremity Color: [Left:Normal] Hair Growth on Extremity: [Left:No] Temperature of Extremity: [Left:Cool] Capillary Refill: [Left:< 3 seconds] Toe Nail Assessment Left: Right: Thick: Yes Discolored: Yes Deformed: Yes Improper Length and Hygiene: No Electronic Signature(s) Signed: 04/25/2015 5:24:25 PM By: Curtis Sites Entered By: Curtis Sites on 04/25/2015 12:17:58 Braziel, Marlyne Kathie Rhodes ([id number]) -------------------------------------------------------------------------------- Multi Wound Chart Details Patient Name: Stacie Mile S. 04/25/2015 11:30 Date of Service: AM Medical Record [id number] Number: Patient Account Number: [id number] [birth year] (73 y.o. Treating RN: Curtis Sites Date of Birth/Sex: Female) Other Clinician: Primary Care Physician: Johnsie Cancel, PETER Referring Physician: Maudie Flakes Physician/Extender: Weeks in Treatment: 1 Vital Signs Height(in): Pulse(bpm): 64 Weight(lbs): 125 Blood Pressure 110/88 (mmHg): Body Mass Index(BMI): Temperature(F): 98.1 Respiratory Rate 16 (breaths/min): Photos: [1:No Photos] [2:No Photos] [N/A:N/A] Wound Location: [1:Left Toe Second] [2:Left Toe Great] [N/A:N/A] Wounding Event: [1:Gradually Appeared] [2:Trauma] [N/A:N/A] Primary Etiology: [1:Pressure Ulcer] [2:Trauma, Other] [N/A:N/A] Comorbid History: [1:Seizure Disorder] [2:Seizure Disorder] [N/A:N/A] Date Acquired: [1:03/28/2015] [2:04/25/2015] [N/A:N/A] Weeks of Treatment: [1:1] [2:0] [N/A:N/A] Wound Status: [1:Open] [2:Open] [N/A:N/A] Measurements L x W x D 0.4x0.4x0.1 [2:0.3x0.8x0.2] [N/A:N/A] (cm) Area (cm) : [1:0.126] [2:0.188] [N/A:N/A] Volume (cm) : [1:0.013] [2:0.038] [N/A:N/A] % Reduction in Area: [  1:33.00%] [2:0.00%] [N/A:N/A] % Reduction in Volume: 65.80% [2:0.00%] [N/A:N/A] Classification: [1:Category/Stage III] [2:Full Thickness Without Exposed Support Structures] [N/A:N/A] Exudate Amount: [1:Medium] [2:Large] [N/A:N/A] Exudate Type: [1:Serous] [2:Sanguinous] [N/A:N/A] Exudate Color: [1:amber] [2:red] [N/A:N/A] Wound Margin: [1:Flat and Intact] [2:Flat and Intact] [N/A:N/A] Granulation Amount: [1:Large (67-100%)] [2:None Present (0%)] [N/A:N/A] Granulation Quality: [1:Pink] [2:N/A] [N/A:N/A] Necrotic Amount: [1:Small (1-33%)] [2:None Present (0%)] [N/A:N/A] Necrotic Tissue: [1:Eschar, Adherent Slough N/A] [N/A:N/A] Exposed Structures: [1:Fascia: No Fat: No Tendon: No] [2:Fascia: No Fat: No Tendon: No] [N/A:N/A] Muscle: No Muscle: No Joint: No Joint: No Bone: No Bone: No Limited to Skin Limited to Skin Breakdown Breakdown Epithelialization: None None N/A Periwound Skin Texture: Edema: No Edema: No N/A Excoriation: No Excoriation: No Induration: No Induration: No Callus: No Callus: No Crepitus: No Crepitus: No Fluctuance:  No Fluctuance: No Friable: No Friable: No Rash: No Rash: No Scarring: No Scarring: No Periwound Skin Moist: Yes Maceration: No N/A Moisture: Maceration: No Moist: No Dry/Scaly: No Dry/Scaly: No Periwound Skin Color: Atrophie Blanche: No Atrophie Blanche: No N/A Cyanosis: No Cyanosis: No Ecchymosis: No Ecchymosis: No Erythema: No Erythema: No Hemosiderin Staining: No Hemosiderin Staining: No Mottled: No Mottled: No Pallor: No Pallor: No Rubor: No Rubor: No Temperature: No Abnormality N/A N/A Tenderness on Yes Yes N/A Palpation: Wound Preparation: Ulcer Cleansing: Ulcer Cleansing: N/A Rinsed/Irrigated with Rinsed/Irrigated with Saline Saline Topical Anesthetic Topical Anesthetic Applied: None Applied: None Treatment Notes Electronic Signature(s) Signed: 04/25/2015 5:24:25 PM By: Curtis Sites Entered By: Curtis Sites on 04/25/2015 12:21:51 Fletes, Koraline Kathie Rhodes ([id number]) -------------------------------------------------------------------------------- Multi-Disciplinary Care Plan Details Patient Name: Stacie Mile S. 04/25/2015 11:30 Date of Service: AM Medical Record [id number] Number: Patient Account Number: [id number] 10-02-1940 (73 y.o. Treating RN: Curtis Sites Date of Birth/Sex: Female) Other Clinician: Primary Care Physician: Johnsie Cancel, PETER Referring Physician: Maudie Flakes Physician/Extender: Tania Ade in Treatment: 1 Active Inactive Abuse / Safety / Falls / Self Care Management Nursing Diagnoses: Impaired physical mobility Goals: Patient will remain injury free Date Initiated: 04/18/2015 Goal Status: Active Interventions: Assess fall risk on admission and as needed Notes: Orientation to the Wound Care Program Nursing Diagnoses: Knowledge deficit related to the wound healing center program Goals: Patient/caregiver will verbalize understanding of the Wound Healing Center Program Date Initiated:  04/18/2015 Goal Status: Active Interventions: Provide education on orientation to the wound center Notes: Pressure Nursing Diagnoses: Knowledge deficit related to causes and risk factors for pressure ulcer development Goals: Schirmer, Charika S. ([id number]) Patient will remain free from development of additional pressure ulcers Date Initiated: 04/18/2015 Goal Status: Active Interventions: Provide education on pressure ulcers Notes: Wound/Skin Impairment Nursing Diagnoses: Impaired tissue integrity Goals: Ulcer/skin breakdown will heal within 14 weeks Date Initiated: 04/18/2015 Goal Status: Active Interventions: Assess ulceration(s) every visit Notes: Electronic Signature(s) Signed: 04/25/2015 5:24:25 PM By: Curtis Sites Entered By: Curtis Sites on 04/25/2015 12:21:42 [name] ([id number]) -------------------------------------------------------------------------------- Patient/Caregiver Education Details Patient Name: Stacie Mile S. 04/25/2015 11:30 Date of Service: AM Medical Record [id number] Number: Patient Account Number: [id number] 03-18-1941 (73 y.o. Treating RN: Curtis Sites Date of Birth/Gender: Female) Other Clinician: Primary Care Physician: Johnsie Cancel, PETER Referring Physician: Maudie Flakes Physician/Extender: Tania Ade in Treatment: 1 Education Assessment Education Provided To: Caregiver Education Topics Provided Wound/Skin Impairment: Handouts: Other: wound care as ordered Methods: Demonstration, Explain/Verbal Responses: State content correctly Electronic Signature(s) Signed: 04/25/2015 5:36:04 PM By: Ardath Sax MD Entered By: Ardath Sax on 04/25/2015 12:38:56 Beeck, Ova Kathie Rhodes ([id number]) -------------------------------------------------------------------------------- Wound Assessment Details Patient Name: Stacie Mile S. 04/25/2015 11:30 Date of  Service: AM Medical  Record [id number] Number: Patient Account Number: [id number] 1941/03/15 (73 y.o. Treating RN: Curtis Sites Date of Birth/Sex: Female) Other Clinician: Primary Care Physician: Johnsie Cancel, PETER Referring Physician: Maudie Flakes Physician/Extender: Weeks in Treatment: 1 Wound Status Wound Number: 1 Primary Etiology: Pressure Ulcer Wound Location: Left Toe Second Wound Status: Open Wounding Event: Gradually Appeared Comorbid History: Seizure Disorder Date Acquired: 03/28/2015 Weeks Of Treatment: 1 Clustered Wound: No Photos Photo Uploaded By: Curtis Sites on 04/25/2015 12:52:53 Wound Measurements Length: (cm) 0.4 % Reduction in A Width: (cm) 0.4 % Reduction in V Depth: (cm) 0.1 Epithelializatio Area: (cm) 0.126 Tunneling: Volume: (cm) 0.013 Undermining: rea: 33% olume: 65.8% n: None No No Wound Description Classification: Category/Stage III Foul Odor After Wound Margin: Flat and Intact Exudate Amount: Medium Exudate Type: Serous Exudate Color: amber Cleansing: No Wound Bed Granulation Amount: Large (67-100%) Exposed Structure Granulation Quality: Pink Fascia Exposed: No Necrotic Amount: Small (1-33%) Fat Layer Exposed: No Adsit, Navea S. ([id number]) Necrotic Quality: Eschar, Adherent Slough Tendon Exposed: No Muscle Exposed: No Joint Exposed: No Bone Exposed: No Limited to Skin Breakdown Periwound Skin Texture Texture Color No Abnormalities Noted: No No Abnormalities Noted: No Callus: No Atrophie Blanche: No Crepitus: No Cyanosis: No Excoriation: No Ecchymosis: No Fluctuance: No Erythema: No Friable: No Hemosiderin Staining: No Induration: No Mottled: No Localized Edema: No Pallor: No Rash: No Rubor: No Scarring: No Temperature / Pain Moisture Temperature: No Abnormality No Abnormalities Noted: No Tenderness on Palpation: Yes Dry / Scaly: No Maceration: No Moist: Yes Wound Preparation Ulcer Cleansing:  Rinsed/Irrigated with Saline Topical Anesthetic Applied: None Treatment Notes Wound #1 (Left Toe Second) 1. Cleansed with: Clean wound with Normal Saline 4. Dressing Applied: Prisma Ag 5. Secondary Dressing Applied Gauze and Kerlix/Conform 7. Secured with Secretary/administrator) Signed: 04/25/2015 5:24:25 PM By: Curtis Sites Entered By: Curtis Sites on 04/25/2015 12:21:07 Flenner, Octaviano Batty ([id number]) -------------------------------------------------------------------------------- Wound Assessment Details Patient Name: Stacie Mile S. 04/25/2015 11:30 Date of Service: AM Medical Record [id number] Number: Patient Account Number: [id number] [birth year] (73 y.o. Treating RN: Curtis Sites Date of Birth/Sex: Female) Other Clinician: Primary Care Physician: Johnsie Cancel, PETER Referring Physician: Maudie Flakes Physician/Extender: Weeks in Treatment: 1 Wound Status Wound Number: 2 Primary Etiology: Trauma, Other Wound Location: Left Toe Great Wound Status: Open Wounding Event: Trauma Comorbid History: Seizure Disorder Date Acquired: 04/25/2015 Weeks Of Treatment: 0 Clustered Wound: No Photos Photo Uploaded By: Curtis Sites on 04/25/2015 12:52:55 Wound Measurements Length: (cm) 0.3 Width: (cm) 0.8 Depth: (cm) 0.2 Area: (cm) 0.188 Volume: (cm) 0.038 % Reduction in Area: 0% % Reduction in Volume: 0% Epithelialization: None Tunneling: No Undermining: No Wound Description Full Thickness Without Exposed Foul Odor After Classification: Support Structures Wound Margin: Flat and Intact Exudate Large Amount: Exudate Type: Sanguinous Exudate Color: red Cleansing: No Wound Bed Granulation Amount: None Present (0%) Exposed Structure Lavigne, Breindel S. ([id number]) Necrotic Amount: None Present (0%) Fascia Exposed: No Fat Layer Exposed: No Tendon Exposed: No Muscle Exposed: No Joint Exposed: No Bone Exposed:  No Limited to Skin Breakdown Periwound Skin Texture Texture Color No Abnormalities Noted: No No Abnormalities Noted: No Callus: No Atrophie Blanche: No Crepitus: No Cyanosis: No Excoriation: No Ecchymosis: No Fluctuance: No Erythema: No Friable: No Hemosiderin Staining: No Induration: No Mottled: No Localized Edema: No Pallor: No Rash: No Rubor: No Scarring: No Temperature / Pain Moisture Tenderness on Palpation: Yes No Abnormalities Noted: No Dry / Scaly: No Maceration: No Moist: No Wound Preparation Ulcer  Cleansing: Rinsed/Irrigated with Saline Topical Anesthetic Applied: None Treatment Notes Wound #2 (Left Toe Great) 1. Cleansed with: Clean wound with Normal Saline 4. Dressing Applied: Prisma Ag 5. Secondary Dressing Applied Gauze and Kerlix/Conform 7. Secured with Secretary/administrator) Signed: 04/25/2015 5:24:25 PM By: Curtis Sites Entered By: Curtis Sites on 04/25/2015 12:21:34 [name] ([id number]) -------------------------------------------------------------------------------- Vitals Details Patient Name: [name] 04/25/2015 11:30 Date of Service: AM Medical Record [id number] Number: Patient Account Number: [id number] [birth year] (73 y.o. Treating RN: Curtis Sites Date of Birth/Sex: Female) Other Clinician: Primary Care Physician: Johnsie Cancel, PETER Referring Physician: Maudie Flakes Physician/Extender: Weeks in Treatment: 1 Vital Signs Time Taken: 12:12 Temperature (F): 98.1 Weight (lbs): 125 Pulse (bpm): 64 Respiratory Rate (breaths/min): 16 Blood Pressure (mmHg): 110/88 Reference Range: 80 - 120 mg / dl Electronic Signature(s) Signed: 04/25/2015 5:24:25 PM By: Curtis Sites Entered By: Curtis Sites on 04/25/2015 12:12:54

## 2015-04-26 NOTE — Progress Notes (Addendum)
ADILYNNE, FITZWATER ([id number]) Visit Report for 04/25/2015 Chief Complaint Document Details Patient Name: [name], [name] 04/25/2015 11:30 Date of Service: AM Medical Record [id number] Number: Patient Account Number: [id number] June 02, 1941 (73 y.o. Treating RN: Curtis Sites Date of Birth/Sex: Female) Other Clinician: Primary Care Treating Feldpausch, Lajean Manes, Akari Crysler Physician: Physician/Extender: Referring Physician: Kandis Cocking in Treatment: 1 Information Obtained from: Caregiver Chief Complaint Wound on the left second toe due to deformed foot causing pressure for about 2 weeks. Electronic Signature(s) Signed: 04/25/2015 5:36:04 PM By: Ardath Sax MD Entered By: Ardath Sax on 04/25/2015 12:35:19 [name] ([id number]) -------------------------------------------------------------------------------- HPI Details Patient Name: [name] 04/25/2015 11:30 Date of Service: AM Medical Record [id number] Number: Patient Account Number: [id number] 10-02-1940 (73 y.o. Treating RN: Curtis Sites Date of Birth/Sex: Female) Other Clinician: Primary Care Treating Feldpausch, Lajean Manes, Daimion Adamcik Physician: Physician/Extender: Referring Physician: Kandis Cocking in Treatment: 1 History of Present Illness Location: left second toe dorsum Quality: Patient reports No Pain. Severity: Patient states wound (s) are getting better. Duration: Patient has had the wound for < 2 weeks prior to presenting for treatment Timing: Pain in wound is Intermittent (comes and goes Context: The wound appeared gradually over time Modifying Factors: Consults to this date include: has been on Keflex for about 10 days Associated Signs and Symptoms: is wheelchair-bound HPI Description: 74 year old patient who has a history of spina bifida, cognitive disability was seen by Dr. Filbert Schilder for a left second toe ulceration about 2 weeks ago. The patient has a  past medical history of spina bifida, seizures, schizophrenia, depression, mental retardation. She has never been a smoker and has had no surgery. x-rays of the left foot were done which showed soft tissue swelling but no erosion involving the phalanx. With a diagnosis of cellulitis of the toe of the left foot the patient was placed on Keflex which was started on 04/09/2015 Electronic Signature(s) Signed: 04/25/2015 5:36:04 PM By: Ardath Sax MD Entered By: Ardath Sax on 04/25/2015 12:35:37 [name] ([id number]) -------------------------------------------------------------------------------- Physical Exam Details Patient Name: Stacie Mile S. 04/25/2015 11:30 Date of Service: AM Medical Record [id number] Number: Patient Account Number: [id number] 26-Dec-1940 (73 y.o. Treating RN: Curtis Sites Date of Birth/Sex: Female) Other Clinician: Primary Care Treating Feldpausch, Lajean Manes, Traivon Morrical Physician: Physician/Extender: Referring Physician: Kandis Cocking in Treatment: 1 Electronic Signature(s) Signed: 04/25/2015 5:36:04 PM By: Ardath Sax MD Entered By: Ardath Sax on 04/25/2015 12:35:44 [name] ([id number]) -------------------------------------------------------------------------------- Physician Orders Details Patient Name: [name] 04/25/2015 11:30 Date of Service: AM Medical Record [id number] Number: Patient Account Number: [id number] Jan 22, 1941 (73 y.o. Treating RN: Curtis Sites Date of Birth/Sex: Female) Other Clinician: Primary Care Treating Feldpausch, Lajean Manes, Esti Demello Physician: Physician/Extender: Referring Physician: Kandis Cocking in Treatment: 1 Verbal / Phone Orders: Yes Clinician: Curtis Sites Read Back and Verified: Yes Diagnosis Coding Wound Cleansing Wound #1 Left Toe Second o Clean wound with Normal Saline. Wound #2 Left Toe Great o Clean wound with Normal  Saline. Anesthetic Wound #1 Left Toe Second o Topical Lidocaine 4% cream applied to wound bed prior to debridement Wound #2 Left Toe Great o Topical Lidocaine 4% cream applied to wound bed prior to debridement Primary Wound Dressing Wound #1 Left Toe Second o Prisma Ag Wound #2 Left Toe Great o Prisma Ag Secondary Dressing Wound #1 Left Toe Second o Gauze and Kerlix/Conform Wound #2 Left Toe Great o Gauze and Kerlix/Conform Dressing Change Frequency Wound #1 Left Toe Second o Change dressing  every other day. Wound #2 Left Toe Great [name], [name] ([id number]) o Change dressing every other day. Follow-up Appointments Wound #1 Left Toe Second o Return Appointment in 1 week. Wound #2 Left Toe Great o Return Appointment in 1 week. Electronic Signature(s) Signed: 04/26/2015 9:57:03 AM By: Ardath Sax MD Previous Signature: 04/25/2015 5:24:25 PM Version By: Curtis Sites Entered By: Ardath Sax on 04/26/2015 09:57:03 [name], [name] ([id number]) -------------------------------------------------------------------------------- Problem List Details Patient Name: Stacie Mile S. 04/25/2015 11:30 Date of Service: AM Medical Record [id number] Number: Patient Account Number: [id number] March 29, 1941 (73 y.o. Treating RN: Curtis Sites Date of Birth/Sex: Female) Other Clinician: Primary Care Treating Feldpausch, Lajean Manes, Lonnell Chaput Physician: Physician/Extender: Referring Physician: Kandis Cocking in Treatment: 1 Active Problems ICD-10 Encounter Code Description Active Date Diagnosis L89.893 Pressure ulcer of other site, stage 3 04/18/2015 Yes S91.105A Unspecified open wound of left lesser toe(s) without 04/18/2015 Yes damage to nail, initial encounter Q05.9 Spina bifida, unspecified 04/18/2015 Yes F99 Mental disorder, not otherwise specified 04/18/2015 Yes Inactive Problems Resolved Problems Electronic Signature(s) Signed:  04/25/2015 5:36:04 PM By: Ardath Sax MD Entered By: Ardath Sax on 04/25/2015 12:35:07 [name] ([id number]) -------------------------------------------------------------------------------- Progress Note Details Patient Name: Stacie Mile S. 04/25/2015 11:30 Date of Service: AM Medical Record [id number] Number: Patient Account Number: [id number] 1941-03-26 (73 y.o. Treating RN: Curtis Sites Date of Birth/Sex: Female) Other Clinician: Primary Care Treating Feldpausch, Lajean Manes, Aum Caggiano Physician: Physician/Extender: Referring Physician: Kandis Cocking in Treatment: 1 Subjective Chief Complaint Information obtained from Caregiver Wound on the left second toe due to deformed foot causing pressure for about 2 weeks. History of Present Illness (HPI) The following HPI elements were documented for the patient's wound: Location: left second toe dorsum Quality: Patient reports No Pain. Severity: Patient states wound (s) are getting better. Duration: Patient has had the wound for < 2 weeks prior to presenting for treatment Timing: Pain in wound is Intermittent (comes and goes Context: The wound appeared gradually over time Modifying Factors: Consults to this date include: has been on Keflex for about 10 days Associated Signs and Symptoms: is wheelchair-bound 74 year old patient who has a history of spina bifida, cognitive disability was seen by Dr. Filbert Schilder for a left second toe ulceration about 2 weeks ago. The patient has a past medical history of spina bifida, seizures, schizophrenia, depression, mental retardation. She has never been a smoker and has had no surgery. x-rays of the left foot were done which showed soft tissue swelling but no erosion involving the phalanx. With a diagnosis of cellulitis of the toe of the left foot the patient was placed on Keflex which was started on 04/09/2015 Objective Constitutional Vitals Time Taken: 12:12 PM,  Weight: 125 lbs, Temperature: 98.1 F, Pulse: 64 bpm, Respiratory Rate: 16 breaths/min, Blood Pressure: 110/88 mmHg. Integumentary (Hair, Skin) [name], [name] ([id number]) Wound #1 status is Open. Original cause of wound was Gradually Appeared. The wound is located on the Left Toe Second. The wound measures 0.4cm length x 0.4cm width x 0.1cm depth; 0.126cm^2 area and 0.013cm^3 volume. The wound is limited to skin breakdown. There is no tunneling or undermining noted. There is a medium amount of serous drainage noted. The wound margin is flat and intact. There is large (67-100%) pink granulation within the wound bed. There is a small (1-33%) amount of necrotic tissue within the wound bed including Eschar and Adherent Slough. The periwound skin appearance exhibited: Moist. The periwound skin appearance did not exhibit: Callus, Crepitus, Excoriation, Fluctuance, Friable,  Induration, Localized Edema, Rash, Scarring, Dry/Scaly, Maceration, Atrophie Blanche, Cyanosis, Ecchymosis, Hemosiderin Staining, Mottled, Pallor, Rubor, Erythema. Periwound temperature was noted as No Abnormality. The periwound has tenderness on palpation. Wound #2 status is Open. Original cause of wound was Trauma. The wound is located on the Left Toe Great. The wound measures 0.3cm length x 0.8cm width x 0.2cm depth; 0.188cm^2 area and 0.038cm^3 volume. The wound is limited to skin breakdown. There is no tunneling or undermining noted. There is a large amount of sanguinous drainage noted. The wound margin is flat and intact. There is no granulation within the wound bed. There is no necrotic tissue within the wound bed. The periwound skin appearance did not exhibit: Callus, Crepitus, Excoriation, Fluctuance, Friable, Induration, Localized Edema, Rash, Scarring, Dry/Scaly, Maceration, Moist, Atrophie Blanche, Cyanosis, Ecchymosis, Hemosiderin Staining, Mottled, Pallor, Rubor, Erythema. The periwound has tenderness on  palpation. Assessment Active Problems ICD-10 L89.893 - Pressure ulcer of other site, stage 3 S91.105A - Unspecified open wound of left lesser toe(s) without damage to nail, initial encounter Q05.9 - Spina bifida, unspecified F99 - Mental disorder, not otherwise specified Plan Wound Cleansing: Wound #1 Left Toe Second: Clean wound with Normal Saline. Wound #2 Left Toe Great: Clean wound with Normal Saline. Anesthetic: Wound #1 Left Toe Second: Topical Lidocaine 4% cream applied to wound bed prior to debridement Wound #2 Left Toe Great: Topical Lidocaine 4% cream applied to wound bed prior to debridement Vanostrand, Daisia S. ([id number]) Primary Wound Dressing: Wound #1 Left Toe Second: Prisma Ag Wound #2 Left Toe Great: Prisma Ag Secondary Dressing: Wound #1 Left Toe Second: Gauze and Kerlix/Conform Wound #2 Left Toe Great: Gauze and Kerlix/Conform Dressing Change Frequency: Wound #1 Left Toe Second: Change dressing every other day. Wound #2 Left Toe Great: Change dressing every other day. Follow-up Appointments: Wound #1 Left Toe Second: Return Appointment in 1 week. Wound #2 Left Toe Great: Return Appointment in 1 week. Follow-Up Appointments: A follow-up appointment should be scheduled. Probable pressure ulcerl1st and 2nd toes are healing . Continue dressing the same Electronic Signature(s) Signed: 04/25/2015 5:36:04 PM By: Ardath Sax MD Entered By: Ardath Sax on 04/25/2015 12:37:42 [name], [name] ([id number]) -------------------------------------------------------------------------------- SuperBill Details Patient Name: [name] Date of Service: 04/25/2015 Medical Record Number: [id number] Patient Account Number: [id number] Date of Birth/Sex: [birth year] (74 y.o. Female) Treating RN: Curtis Sites Primary Care Physician: Maudie Flakes Other Clinician: Referring Physician: Maudie Flakes Treating Physician/Extender: Elayne Snare in Treatment: 1 Diagnosis Coding ICD-10 Codes Code Description [phone] Pressure ulcer of other site, stage 3 S91.105A Unspecified open wound of left lesser toe(s) without damage to nail, initial encounter Q05.9 Spina bifida, unspecified F99 Mental disorder, not otherwise specified Facility Procedures CPT4 Code: [id number] Description: 99213 - WOUND CARE VISIT-LEV 3 EST PT Modifier: Quantity: 1 Physician Procedures CPT4: Description Modifier Quantity Code [id number] 99211 - WC PHYS LEVEL 1 EST PT 1 ICD-10 Description Diagnosis S91.105A Unspecified open wound of left lesser toe(s) without damage to nail, initial encounter Electronic Signature(s) Signed: 04/25/2015 5:36:04 PM By: Ardath Sax MD Entered By: Ardath Sax on 04/25/2015 12:38:18

## 2015-05-03 ENCOUNTER — Encounter: Payer: Medicare Other | Admitting: Surgery

## 2015-05-03 DIAGNOSIS — L89893 Pressure ulcer of other site, stage 3: Secondary | ICD-10-CM | POA: Diagnosis not present

## 2015-05-04 NOTE — Progress Notes (Signed)
[name] ([id number]) Visit Report for 05/03/2015 Chief Complaint Document Details Patient Name: [name], [name] 05/03/2015 9:30 Date of Service: AM Medical Record [id number] Number: Patient Account Number: [id number] Oct 15, 1940 (74 y.o. Treating RN: [name] Date of Birth/Sex: Female) Other Clinician: Primary Care Physician: [name] Referring Physician: Maudie Reyes Physician/Extender: [name] in Treatment: 2 Information Obtained from: Caregiver Chief Complaint Wound on the left second toe due to deformed foot causing pressure for about 2 weeks. Electronic Signature(s) Signed: 05/03/2015 10:11:26 AM By: Stacie Kanner MD, FACS Entered By: [name] on 05/03/2015 10:11:26 [name] ([id number]) -------------------------------------------------------------------------------- HPI Details Patient Name: [name] 05/03/2015 9:30 Date of Service: AM Medical Record [id number] Number: Patient Account Number: [id number] 04-29-1941 (73 y.o. Treating RN: [name] Date of Birth/Sex: Female) Other Clinician: Primary Care Physician: [name] Referring Physician: Maudie Reyes Physician/Extender: Weeks in Treatment: 2 History of Present Illness Location: left second toe dorsum Quality: Patient reports No Pain. Severity: Patient states wound (s) are getting better. Duration: Patient has had the wound for < 2 weeks prior to presenting for treatment Timing: Pain in wound is Intermittent (comes and goes Context: The wound appeared gradually over time Modifying Factors: Consults to this date include: has been on Keflex for about 10 days Associated Signs and Symptoms: is wheelchair-bound HPI Description: 74 year old patient who has a history of spina bifida, cognitive disability was seen by Dr. Filbert Reyes for a left second toe ulceration about 2 weeks ago. The patient has  a past medical history of spina bifida, seizures, schizophrenia, depression, mental retardation. She has never been a smoker and has had no surgery. x-rays of the left foot were done which showed soft tissue swelling but no erosion involving the phalanx. With a diagnosis of cellulitis of the toe of the left foot the patient was placed on Keflex which was started on 04/09/2015 05/03/2015 -- while trying to clip her toenails which were rather long she had a superficial injury to the left big toe which is now an ulcer. She is yet to see a podiatrist. Electronic Signature(s) Signed: 05/03/2015 10:12:07 AM By: Stacie Kanner MD, FACS Entered By: [name] on 05/03/2015 10:12:07 [name] ([id number]) -------------------------------------------------------------------------------- Physical Exam Details Patient Name: Stacie Mile S. 05/03/2015 9:30 Date of Service: AM Medical Record [id number] Number: Patient Account Number: [id number] 1941/06/18 (73 y.o. Treating RN: [name] Date of Birth/Sex: Female) Other Clinician: Primary Care Physician: [name] Referring Physician: Maudie Reyes Physician/Extender: Weeks in Treatment: 2 Constitutional . Pulse regular. Respirations normal and unlabored. Afebrile. . Eyes Nonicteric. Reactive to light. Ears, Nose, Mouth, and Throat Lips, teeth, and gums WNL.Marland Kitchen Moist mucosa without lesions . Neck supple and nontender. No palpable supraclavicular or cervical adenopathy. Normal sized without goiter. Respiratory WNL. No retractions.. Breath sounds WNL, No rubs, rales, rhonchi, or wheeze.. Cardiovascular Heart rhythm and rate regular, no murmur or gallop.. Pedal Pulses WNL. No clubbing, cyanosis or edema. Lymphatic No adneopathy. No adenopathy. No adenopathy. Musculoskeletal Adexa without tenderness or enlargement.. Digits and nails w/o clubbing, cyanosis, infection, petechiae, ischemia, or  inflammatory conditions.. Integumentary (Hair, Skin) No suspicious lesions. No crepitus or fluctuance. No peri-wound warmth or erythema. No masses.Marland Kitchen Psychiatric Judgement and insight Intact.. No evidence of depression, anxiety, or agitation.. Notes the patient is difficult to examine but the second toe on the left foot now is healed. She has a first ulceration on the left great toe which does not need debridement.  Electronic Signature(s) Signed: 05/03/2015 10:12:40 AM By: Stacie Kanner MD, FACS Entered By: [name] on 05/03/2015 10:12:40 [name] ([id number]) -------------------------------------------------------------------------------- Physician Orders Details Patient Name: [name] 05/03/2015 9:30 Date of Service: AM Medical Record [id number] Number: Patient Account Number: [id number] July 25, 1940 (73 y.o. Treating RN: [name] Date of Birth/Sex: Female) Other Clinician: Primary Care Physician: [name] Referring Physician: Maudie Reyes Physician/Extender: [name] in Treatment: 2 Verbal / Phone Orders: Yes Clinician: Curtis Reyes Read Back and Verified: Yes Diagnosis Coding Wound Cleansing Wound #2 Left Toe Great o Clean wound with Normal Saline. Anesthetic Wound #2 Left Toe Great o Topical Lidocaine 4% cream applied to wound bed prior to debridement Primary Wound Dressing Wound #2 Left Toe Great o Prisma Ag Secondary Dressing Wound #2 Left Toe Great o Gauze and Kerlix/Conform Dressing Change Frequency Wound #2 Left Toe Great o Change dressing every other day. Follow-up Appointments Wound #2 Left Toe Great o Return Appointment in 1 week. Notes Still needs to see podiatry and continue with podiatry from here on out for foot care to avoid injuries Electronic Signature(s) Signed: 05/03/2015 4:16:00 PM By: Stacie Kanner MD, FACS Signed: 05/03/2015 5:20:05 PM By: [name] Entered  By: [name] on 05/03/2015 10:10:13 [name] ([id number]) [name] ([id number]) -------------------------------------------------------------------------------- Problem List Details Patient Name: Stacie Mile S. 05/03/2015 9:30 Date of Service: AM Medical Record [id number] Number: Patient Account Number: [id number] April 19, 1941 (73 y.o. Treating RN: [name] Date of Birth/Sex: Female) Other Clinician: Primary Care Physician: [name] Referring Physician: Maudie Reyes Physician/Extender: [name] in Treatment: 2 Active Problems ICD-10 Encounter Code Description Active Date Diagnosis L89.893 Pressure ulcer of other site, stage 3 04/18/2015 Yes S91.105A Unspecified open wound of left lesser toe(s) without 04/18/2015 Yes damage to nail, initial encounter Q05.9 Spina bifida, unspecified 04/18/2015 Yes F99 Mental disorder, not otherwise specified 04/18/2015 Yes Inactive Problems Resolved Problems Electronic Signature(s) Signed: 05/03/2015 10:11:17 AM By: Stacie Kanner MD, FACS Entered By: [name] on 05/03/2015 10:11:16 Beidler, Nedda Kathie Reyes ([id number]) -------------------------------------------------------------------------------- Progress Note Details Patient Name: Stacie Mile S. 05/03/2015 9:30 Date of Service: AM Medical Record [id number] Number: Patient Account Number: [id number] Dec 01, 1940 (73 y.o. Treating RN: [name] Date of Birth/Sex: Female) Other Clinician: Primary Care Physician: [name] Referring Physician: Maudie Reyes Physician/Extender: [name] in Treatment: 2 Subjective Chief Complaint Information obtained from Caregiver Wound on the left second toe due to deformed foot causing pressure for about 2 weeks. History of Present Illness (HPI) The following HPI elements were documented for the patient's wound: Location: left second toe  dorsum Quality: Patient reports No Pain. Severity: Patient states wound (s) are getting better. Duration: Patient has had the wound for < 2 weeks prior to presenting for treatment Timing: Pain in wound is Intermittent (comes and goes Context: The wound appeared gradually over time Modifying Factors: Consults to this date include: has been on Keflex for about 10 days Associated Signs and Symptoms: is wheelchair-bound 74 year old patient who has a history of spina bifida, cognitive disability was seen by Dr. Filbert Reyes for a left second toe ulceration about 2 weeks ago. The patient has a past medical history of spina bifida, seizures, schizophrenia, depression, mental retardation. She has never been a smoker and has had no surgery. x-rays of the left foot were done which showed soft tissue swelling but no erosion involving the phalanx. With a diagnosis of cellulitis of the toe of the left foot the patient was  placed on Keflex which was started on 04/09/2015 05/03/2015 -- while trying to clip her toenails which were rather long she had a superficial injury to the left big toe which is now an ulcer. She is yet to see a podiatrist. Objective Constitutional Pulse regular. Respirations normal and unlabored. Afebrile. Vitals Time Taken: 9:52 AM, Weight: 125 lbs, Pulse: 67 bpm, Respiratory Rate: 16 breaths/min, Blood [name], [name] ([id number]) Pressure: 181/83 mmHg. Eyes Nonicteric. Reactive to light. Ears, Nose, Mouth, and Throat Lips, teeth, and gums WNL.Marland Kitchen. Moist mucosa without lesions . Neck supple and nontender. No palpable supraclavicular or cervical adenopathy. Normal sized without goiter. Respiratory WNL. No retractions.. Breath sounds WNL, No rubs, rales, rhonchi, or wheeze.. Cardiovascular Heart rhythm and rate regular, no murmur or gallop.. Pedal Pulses WNL. No clubbing, cyanosis or edema. Lymphatic No adneopathy. No adenopathy. No adenopathy. Musculoskeletal Adexa without  tenderness or enlargement.. Digits and nails w/o clubbing, cyanosis, infection, petechiae, ischemia, or inflammatory conditions.Marland Kitchen. Psychiatric Judgement and insight Intact.. No evidence of depression, anxiety, or agitation.. General Notes: the patient is difficult to examine but the second toe on the left foot now is healed. She has a first ulceration on the left great toe which does not need debridement. Integumentary (Hair, Skin) No suspicious lesions. No crepitus or fluctuance. No peri-wound warmth or erythema. No masses.. Wound #1 status is Open. Original cause of wound was Gradually Appeared. The wound is located on the Left Toe Second. The wound measures 0cm length x 0cm width x 0cm depth; 0cm^2 area and 0cm^3 volume. Wound #2 status is Open. Original cause of wound was Trauma. The wound is located on the Left Toe Great. The wound measures 0.5cm length x 0.7cm width x 0.1cm depth; 0.275cm^2 area and 0.027cm^3 volume. The wound is limited to skin breakdown. There is no tunneling or undermining noted. There is a large amount of sanguinous drainage noted. The wound margin is flat and intact. There is large (67-100%) granulation within the wound bed. There is no necrotic tissue within the wound bed. The periwound skin appearance did not exhibit: Callus, Crepitus, Excoriation, Fluctuance, Friable, Induration, Localized Edema, Rash, Scarring, Dry/Scaly, Maceration, Moist, Atrophie Blanche, Cyanosis, Ecchymosis, Hemosiderin Staining, Mottled, Pallor, Rubor, Erythema. The periwound has tenderness on palpation. [name], [name] ([id number]) Assessment Active Problems ICD-10 [phone] - Pressure ulcer of other site, stage 3 S91.105A - Unspecified open wound of left lesser toe(s) without damage to nail, initial encounter Q05.9 - Spina bifida, unspecified F99 - Mental disorder, not otherwise specified I have recommended Prisma to be applied to the left big toe where she has had a superficial  ulceration. we will try to offload this as much as possible. I have also recommended she keep an appointment with a podiatrist to get all the other toenails cut and be seen by them on a regular basis. Plan Wound Cleansing: Wound #2 Left Toe Great: Clean wound with Normal Saline. Anesthetic: Wound #2 Left Toe Great: Topical Lidocaine 4% cream applied to wound bed prior to debridement Primary Wound Dressing: Wound #2 Left Toe Great: Prisma Ag Secondary Dressing: Wound #2 Left Toe Great: Gauze and Kerlix/Conform Dressing Change Frequency: Wound #2 Left Toe Great: Change dressing every other day. Follow-up Appointments: Wound #2 Left Toe Great: Return Appointment in 1 week. General Notes: Still needs to see podiatry and continue with podiatry from here on out for foot care to avoid injuries [name], [name] ([id number]) I have recommended Prisma to be applied to the left big toe where she  has had a superficial ulceration. we will try to offload this as much as possible. I have also recommended she keep an appointment with a podiatrist to get all the other toenails cut and be seen by them on a regular basis. Electronic Signature(s) Signed: 05/03/2015 10:13:25 AM By: Stacie Kanner MD, FACS Entered By: [name] on 05/03/2015 10:13:24 [name], [name] ([id number]) -------------------------------------------------------------------------------- SuperBill Details Patient Name: [name] Date of Service: 05/03/2015 Medical Record Number: [id number] Patient Account Number: [id number] Date of Birth/Sex: 03/24/1941 (74 y.o. Female) Treating RN: [name] Primary Care Physician: [name] Other Clinician: Referring Physician: Maudie Reyes Treating Physician/Extender: Rudene Re in Treatment: 2 Diagnosis Coding ICD-10 Codes Code Description [phone] Pressure ulcer of other site, stage 3 S91.105A Unspecified open wound of left lesser toe(s)  without damage to nail, initial encounter Q05.9 Spina bifida, unspecified F99 Mental disorder, not otherwise specified Facility Procedures CPT4 Code: [id number] Description: 99213 - WOUND CARE VISIT-LEV 3 EST PT Modifier: Quantity: 1 Physician Procedures CPT4: Description Modifier Quantity Code [id number] 99213 - WC PHYS LEVEL 3 - EST PT 1 ICD-10 Description Diagnosis L89.893 Pressure ulcer of other site, stage 3 S91.105A Unspecified open wound of left lesser toe(s) without damage to nail, initial encounter  F99 Mental disorder, not otherwise specified Q05.9 Spina bifida, unspecified Electronic Signature(s) Signed: 05/03/2015 10:13:42 AM By: Stacie Kanner MD, FACS Entered By: [name] on 05/03/2015 10:13:42

## 2015-05-04 NOTE — Progress Notes (Signed)
[name] ([id number]) Visit Report for 05/03/2015 Arrival Information Details Patient Name: [name], [name] Date of Service: 05/03/2015 9:30 AM Medical Record Number: [id number] Patient Account Number: [id number] Date of Birth/Sex: 1941-04-27 (73 y.o. Female) Treating RN: Curtis Sites Primary Care Physician: Maudie Flakes Other Clinician: Referring Physician: Maudie Flakes Treating Physician/Extender: Rudene Re in Treatment: 2 Visit Information History Since Last Visit Added or deleted any medications: No Patient Arrived: Wheel Chair Any new allergies or adverse reactions: No Arrival Time: 09:51 Had a fall or experienced change in No activities of daily living that may affect Accompanied By: staff risk of falls: Transfer Assistance: Manual Signs or symptoms of abuse/neglect since last No Patient Identification Verified: Yes visito Secondary Verification Process Yes Hospitalized since last visit: No Completed: Pain Present Now: No Electronic Signature(Reyes) Signed: 05/03/2015 5:20:05 PM By: Curtis Sites Entered By: Curtis Sites on 05/03/2015 09:51:49 [name] ([id number]) -------------------------------------------------------------------------------- Clinic Level of Care Assessment Details Patient Name: [name], [name] Date of Service: 05/03/2015 9:30 AM Medical Record Number: [id number] Patient Account Number: [id number] Date of Birth/Sex: 23-Nov-1940 (73 y.o. Female) Treating RN: Curtis Sites Primary Care Physician: Maudie Flakes Other Clinician: Referring Physician: Maudie Flakes Treating Physician/Extender: Rudene Re in Treatment: 2 Clinic Level of Care Assessment Items TOOL 4 Quantity Score  - Use when only an EandM is performed on FOLLOW-UP visit 0 ASSESSMENTS - Nursing Assessment / Reassessment X - Reassessment of Co-morbidities (includes updates in patient status) 1 10 X - Reassessment of  Adherence to Treatment Plan 1 5 ASSESSMENTS - Wound and Skin Assessment / Reassessment  - Simple Wound Assessment / Reassessment - one wound 0 X - Complex Wound Assessment / Reassessment - multiple wounds 2 5  - Dermatologic / Skin Assessment (not related to wound area) 0 ASSESSMENTS - Focused Assessment  - Circumferential Edema Measurements - multi extremities 0  - Nutritional Assessment / Counseling / Intervention 0 X - Lower Extremity Assessment (monofilament, tuning fork, pulses) 1 5  - Peripheral Arterial Disease Assessment (using hand held doppler) 0 ASSESSMENTS - Ostomy and/or Continence Assessment and Care  - Incontinence Assessment and Management 0  - Ostomy Care Assessment and Management (repouching, etc.) 0 PROCESS - Coordination of Care X - Simple Patient / Family Education for ongoing care 1 15  - Complex (extensive) Patient / Family Education for ongoing care 0  - Staff obtains Chiropractor, Records, Test Results / Process Orders 0  - Staff telephones HHA, Nursing Homes / Clarify orders / etc 0  - Routine Transfer to another Facility (non-emergent condition) 0 [name], [name] ([id number])  - Routine Hospital Admission (non-emergent condition) 0  - New Admissions / Manufacturing engineer / Ordering NPWT, Apligraf, etc. 0  - Emergency Hospital Admission (emergent condition) 0 X - Simple Discharge Coordination 1 10  - Complex (extensive) Discharge Coordination 0 PROCESS - Special Needs  - Pediatric / Minor Patient Management 0  - Isolation Patient Management 0  - Hearing / Language / Visual special needs 0  - Assessment of Community assistance (transportation, D/C planning, etc.) 0  - Additional assistance / Altered mentation 0  - Support Surface(Reyes) Assessment (bed, cushion, seat, etc.) 0 INTERVENTIONS - Wound Cleansing / Measurement  - Simple Wound Cleansing - one wound 0 X - Complex Wound Cleansing - multiple wounds 2 5 X -  Wound Imaging (photographs - any number of wounds) 1 5  - Wound Tracing (instead of photographs) 0  - Simple Wound Measurement - one wound 0  X - Complex Wound Measurement - multiple wounds 2 5 INTERVENTIONS - Wound Dressings X - Small Wound Dressing one or multiple wounds 1 10 []  - Medium Wound Dressing one or multiple wounds 0 []  - Large Wound Dressing one or multiple wounds 0 []  - Application of Medications - topical 0 []  - Application of Medications - injection 0 INTERVENTIONS - Miscellaneous []  - External ear exam 0 [name] ([id number]) []  - Specimen Collection (cultures, biopsies, blood, body fluids, etc.) 0 []  - Specimen(Reyes) / Culture(Reyes) sent or taken to Lab for analysis 0 []  - Patient Transfer (multiple staff / Michiel SitesHoyer Lift / Similar devices) 0 []  - Simple Staple / Suture removal (25 or less) 0 []  - Complex Staple / Suture removal (26 or more) 0 []  - Hypo / Hyperglycemic Management (close monitor of Blood Glucose) 0 []  - Ankle / Brachial Index (ABI) - do not check if billed separately 0 X - Vital Signs 1 5 Has the patient been seen at the hospital within the last three years: Yes Total Score: 95 Level Of Care: New/Established - Level 3 Electronic Signature(Reyes) Signed: 05/03/2015 5:20:05 PM By: Curtis Sitesorthy, Joanna Entered By: Curtis Sitesorthy, Joanna on 05/03/2015 10:09:08 [name], [name] ([id number]) -------------------------------------------------------------------------------- Encounter Discharge Information Details Patient Name: [name] Date of Service: 05/03/2015 9:30 AM Medical Record Number: [id number] Patient Account Number: [id number] Date of Birth/Sex: Dec 06, 1940 (73 y.o. Female) Treating RN: Curtis Sitesorthy, Joanna Primary Care Physician: Maudie FlakesFeldpausch, Dale Other Clinician: Referring Physician: Maudie FlakesFeldpausch, Dale Treating Physician/Extender: Rudene ReBritto, Errol Weeks in Treatment: 2 Encounter Discharge Information Items Discharge Pain Level: 0 Discharge Condition:  Stable Ambulatory Status: Wheelchair Discharge Destination: Home Transportation: Private Auto Accompanied By: staff Schedule Follow-up Appointment: Yes Medication Reconciliation completed and provided to Patient/Care No Jules Baty: Provided on Clinical Summary of Care: 05/03/2015 Form Type Recipient Paper Patient RB Electronic Signature(Reyes) Signed: 05/03/2015 5:20:05 PM By: Curtis Sitesorthy, Joanna Previous Signature: 05/03/2015 10:20:24 AM Version By: Gwenlyn PerkingMoore, Shelia Entered By: Curtis Sitesorthy, Joanna on 05/03/2015 10:25:45 Radde, Ayssa Reyes. ([id number]) -------------------------------------------------------------------------------- Lower Extremity Assessment Details Patient Name: Stacie GreaserBOSWELL, [name] Date of Service: 05/03/2015 9:30 AM Medical Record Number: [id number] Patient Account Number: [id number] Date of Birth/Sex: Dec 06, 1940 (73 y.o. Female) Treating RN: Curtis Sitesorthy, Joanna Primary Care Physician: Maudie FlakesFeldpausch, Dale Other Clinician: Referring Physician: Maudie FlakesFeldpausch, Dale Treating Physician/Extender: Rudene ReBritto, Errol Weeks in Treatment: 2 Edema Assessment Assessed: Kyra Searles[Left: No] [Right: No] Edema: [Left: N] [Right: o] Vascular Assessment Pulses: Posterior Tibial Dorsalis Pedis Palpable: [Left:Yes] Extremity colors, hair growth, and conditions: Extremity Color: [Left:Normal] Hair Growth on Extremity: [Left:Yes] Temperature of Extremity: [Left:Cool] Capillary Refill: [Left:< 3 seconds] Electronic Signature(Reyes) Signed: 05/03/2015 5:20:05 PM By: Curtis Sitesorthy, Joanna Entered By: Curtis Sitesorthy, Joanna on 05/03/2015 10:00:04 Hamad, Novella Reyes. ([id number]) -------------------------------------------------------------------------------- Multi Wound Chart Details Patient Name: [name] Date of Service: 05/03/2015 9:30 AM Medical Record Number: [id number] Patient Account Number: [id number] Date of Birth/Sex: Dec 06, 1940 (73 y.o. Female) Treating RN: Curtis Sitesorthy, Joanna Primary Care Physician: Maudie FlakesFeldpausch,  Dale Other Clinician: Referring Physician: Maudie FlakesFeldpausch, Dale Treating Physician/Extender: Rudene ReBritto, Errol Weeks in Treatment: 2 Vital Signs Height(in): Pulse(bpm): 67 Weight(lbs): 125 Blood Pressure 181/83 (mmHg): Body Mass Index(BMI): Temperature(F): Respiratory Rate 16 (breaths/min): Photos: [1:No Photos] [2:No Photos] [N/A:N/A] Wound Location: [1:Left Toe Second] [2:Left Toe Great] [N/A:N/A] Wounding Event: [1:Gradually Appeared] [2:Trauma] [N/A:N/A] Primary Etiology: [1:Pressure Ulcer] [2:Trauma, Other] [N/A:N/A] Comorbid History: [1:N/A] [2:Seizure Disorder] [N/A:N/A] Date Acquired: [1:03/28/2015] [2:04/25/2015] [N/A:N/A] Weeks of Treatment: [1:2] [2:1] [N/A:N/A] Wound Status: [1:Open] [2:Open] [N/A:N/A] Measurements L x W x D 0x0x0 [2:0.5x0.7x0.1] [N/A:N/A] (cm) Area (cm) : [1:0] [2:0.275] [  N/A:N/A] Volume (cm) : [1:0] [2:0.027] [N/A:N/A] % Reduction in Area: [1:100.00%] [2:-46.30%] [N/A:N/A] % Reduction in Volume: 100.00% [2:28.90%] [N/A:N/A] Classification: [1:Category/Stage III] [2:Full Thickness Without Exposed Support Structures] [N/A:N/A] Exudate Amount: [1:N/A] [2:Large] [N/A:N/A] Exudate Type: [1:N/A] [2:Sanguinous] [N/A:N/A] Exudate Color: [1:N/A] [2:red] [N/A:N/A] Wound Margin: [1:N/A] [2:Flat and Intact] [N/A:N/A] Granulation Amount: [1:N/A] [2:Large (67-100%)] [N/A:N/A] Necrotic Amount: [1:N/A] [2:None Present (0%)] [N/A:N/A] Epithelialization: [1:N/A] [2:None] [N/A:N/A] Periwound Skin Texture: No Abnormalities Noted [2:Edema: No Excoriation: No Induration: No Callus: No Crepitus: No Fluctuance: No] [N/A:N/A] Friable: No Rash: No Scarring: No Periwound Skin No Abnormalities Noted Maceration: No N/A Moisture: Moist: No Dry/Scaly: No Periwound Skin Color: No Abnormalities Noted Atrophie Blanche: No N/A Cyanosis: No Ecchymosis: No Erythema: No Hemosiderin Staining: No Mottled: No Pallor: No Rubor: No Tenderness on No Yes N/A Palpation: Wound  Preparation: N/A Ulcer Cleansing: N/A Rinsed/Irrigated with Saline Topical Anesthetic Applied: None Treatment Notes Electronic Signature(Reyes) Signed: 05/03/2015 5:20:05 PM By: Curtis Sites Entered By: Curtis Sites on 05/03/2015 10:08:23 Fettig, [name] ([id number]) -------------------------------------------------------------------------------- Multi-Disciplinary Care Plan Details Patient Name: [name] Reyes. Date of Service: 05/03/2015 9:30 AM Medical Record Number: [id number] Patient Account Number: [id number] Date of Birth/Sex: 1941-05-25 (73 y.o. Female) Treating RN: Curtis Sites Primary Care Physician: Maudie Flakes Other Clinician: Referring Physician: Maudie Flakes Treating Physician/Extender: Rudene Re in Treatment: 2 Active Inactive Abuse / Safety / Falls / Self Care Management Nursing Diagnoses: Impaired physical mobility Goals: Patient will remain injury free Date Initiated: 04/18/2015 Goal Status: Active Interventions: Assess fall risk on admission and as needed Notes: Orientation to the Wound Care Program Nursing Diagnoses: Knowledge deficit related to the wound healing center program Goals: Patient/caregiver will verbalize understanding of the Wound Healing Center Program Date Initiated: 04/18/2015 Goal Status: Active Interventions: Provide education on orientation to the wound center Notes: Pressure Nursing Diagnoses: Knowledge deficit related to causes and risk factors for pressure ulcer development Goals: Patient will remain free from development of additional pressure ulcers Date Initiated: 04/18/2015 BEANCA, KIESTER. ([id number]) Goal Status: Active Interventions: Provide education on pressure ulcers Notes: Wound/Skin Impairment Nursing Diagnoses: Impaired tissue integrity Goals: Ulcer/skin breakdown will heal within 14 weeks Date Initiated: 04/18/2015 Goal Status: Active Interventions: Assess ulceration(Reyes)  every visit Notes: Electronic Signature(Reyes) Signed: 05/03/2015 5:20:05 PM By: Curtis Sites Entered By: Curtis Sites on 05/03/2015 10:08:11 Haught, [name] ([id number]) -------------------------------------------------------------------------------- Patient/Caregiver Education Details Patient Name: Stacie Coach. Date of Service: 05/03/2015 9:30 AM Medical Record Number: [id number] Patient Account Number: [id number] Date of Birth/Gender: 09-01-1940 (73 y.o. Female) Treating RN: Curtis Sites Primary Care Physician: Maudie Flakes Other Clinician: Referring Physician: Maudie Flakes Treating Physician/Extender: Rudene Re in Treatment: 2 Education Assessment Education Provided To: Caregiver Education Topics Provided Wound/Skin Impairment: Handouts: Other: still needs podiatry Methods: Explain/Verbal Responses: State content correctly Electronic Signature(Reyes) Signed: 05/03/2015 5:20:05 PM By: Curtis Sites Entered By: Curtis Sites on 05/03/2015 10:26:03 Schwall, [name] ([id number]) -------------------------------------------------------------------------------- Wound Assessment Details Patient Name: [name], Jadelynn Reyes. Date of Service: 05/03/2015 9:30 AM Medical Record Number: [id number] Patient Account Number: [id number] Date of Birth/Sex: [birth year] (73 y.o. Female) Treating RN: Curtis Sites Primary Care Physician: Maudie Flakes Other Clinician: Referring Physician: Maudie Flakes Treating Physician/Extender: Rudene Re in Treatment: 2 Wound Status Wound Number: 1 Primary Etiology: Pressure Ulcer Wound Location: Left Toe Second Wound Status: Open Wounding Event: Gradually Appeared Date Acquired: 03/28/2015 Weeks Of Treatment: 2 Clustered Wound: No Photos Photo Uploaded By: Curtis Sites on 05/03/2015 12:03:13 Wound Measurements Length: (cm) 0 % Reduction in Width: (cm)  0 % Reduction in Depth: (cm) 0 Area: (cm)  0 Volume: (cm) 0 Area: 100% Volume: 100% Wound Description Classification: Category/Stage III Periwound Skin Texture Texture Color No Abnormalities Noted: No No Abnormalities Noted: No Moisture No Abnormalities Noted: No Electronic Signature(Reyes) Signed: 05/03/2015 5:20:05 PM By: Sharon Mt, [name] ([id number]) Entered By: Curtis Sites on 05/03/2015 09:59:25 Matteo, Emalyn Reyes. ([id number]) -------------------------------------------------------------------------------- Wound Assessment Details Patient Name: [name], [name] Date of Service: 05/03/2015 9:30 AM Medical Record Number: [id number] Patient Account Number: [id number] Date of Birth/Sex: September 25, 1940 (73 y.o. Female) Treating RN: Curtis Sites Primary Care Physician: Maudie Flakes Other Clinician: Referring Physician: Maudie Flakes Treating Physician/Extender: Rudene Re in Treatment: 2 Wound Status Wound Number: 2 Primary Etiology: Trauma, Other Wound Location: Left Toe Great Wound Status: Open Wounding Event: Trauma Comorbid History: Seizure Disorder Date Acquired: 04/25/2015 Weeks Of Treatment: 1 Clustered Wound: No Photos Photo Uploaded By: Curtis Sites on 05/03/2015 12:03:13 Wound Measurements Length: (cm) 0.5 % Reduction in Width: (cm) 0.7 % Reduction in Depth: (cm) 0.1 Epithelializati Area: (cm) 0.275 Tunneling: Volume: (cm) 0.027 Undermining: Area: -46.3% Volume: 28.9% on: None No No Wound Description Full Thickness Without Exposed Classification: Support Structures Wound Margin: Flat and Intact Exudate Large Amount: Exudate Type: Sanguinous Exudate Color: red Foul Odor After Cleansing: No Wound Bed Granulation Amount: Large (67-100%) Exposed Structure Necrotic Amount: None Present (0%) Fascia Exposed: No Fat Layer Exposed: No Traum, Ria Reyes. ([id number]) Tendon Exposed: No Muscle Exposed: No Joint Exposed: No Bone Exposed: No Limited  to Skin Breakdown Periwound Skin Texture Texture Color No Abnormalities Noted: No No Abnormalities Noted: No Callus: No Atrophie Blanche: No Crepitus: No Cyanosis: No Excoriation: No Ecchymosis: No Fluctuance: No Erythema: No Friable: No Hemosiderin Staining: No Induration: No Mottled: No Localized Edema: No Pallor: No Rash: No Rubor: No Scarring: No Temperature / Pain Moisture Tenderness on Palpation: Yes No Abnormalities Noted: No Dry / Scaly: No Maceration: No Moist: No Wound Preparation Ulcer Cleansing: Rinsed/Irrigated with Saline Topical Anesthetic Applied: None Treatment Notes Wound #2 (Left Toe Great) 1. Cleansed with: Clean wound with Normal Saline 4. Dressing Applied: Prisma Ag 5. Secondary Dressing Applied Gauze and Kerlix/Conform 7. Secured with Secretary/administrator) Signed: 05/03/2015 5:20:05 PM By: Curtis Sites Entered By: Curtis Sites on 05/03/2015 09:59:42 [name], [name] Kitchen ([id number]) -------------------------------------------------------------------------------- Vitals Details Patient Name: Stacie Coach. Date of Service: 05/03/2015 9:30 AM Medical Record Number: [id number] Patient Account Number: [id number] Date of Birth/Sex: 11/22/1940 (73 y.o. Female) Treating RN: Curtis Sites Primary Care Physician: Maudie Flakes Other Clinician: Referring Physician: Maudie Flakes Treating Physician/Extender: Rudene Re in Treatment: 2 Vital Signs Time Taken: 09:52 Pulse (bpm): 67 Weight (lbs): 125 Respiratory Rate (breaths/min): 16 Blood Pressure (mmHg): 181/83 Reference Range: 80 - 120 mg / dl Electronic Signature(Reyes) Signed: 05/03/2015 5:20:05 PM By: Curtis Sites Entered By: Curtis Sites on 05/03/2015 09:53:27

## 2015-05-10 ENCOUNTER — Encounter: Payer: Medicare Other | Admitting: Surgery

## 2015-05-10 DIAGNOSIS — L89893 Pressure ulcer of other site, stage 3: Secondary | ICD-10-CM | POA: Diagnosis not present

## 2015-05-11 NOTE — Progress Notes (Signed)
[name] ([id number]) Visit Report for 05/10/2015 Arrival Information Details Patient Name: [name], [name] 05/10/2015 10:15 Date of Service: AM Medical Record [id number] Number: Patient Account Number: [id number] 09/11/1940 (74 y.o. Treating RN: [name] Date of Birth/Sex: Female) Other Clinician: Primary Care Reyes: [name] Referring Reyes: [name] Reyes/Extender: [name] in Reyes: 3 Visit Information History Since Last Visit Added or deleted any medications: No Patient Arrived: Wheel Chair Any new allergies or adverse reactions: No Arrival Time: 10:21 Had a fall or experienced change in No activities of daily living that may affect Accompanied By: staff risk of falls: Transfer Assistance: Manual Signs or symptoms of abuse/neglect since last No Patient Identification Verified: Yes visito Secondary Verification Process Yes Hospitalized since last visit: No Completed: Pain Present Now: No Electronic Signature(s) Signed: 05/10/2015 5:44:39 PM By: [name] Entered By: [name] on 05/10/2015 10:25:53 [name] ([id number]) -------------------------------------------------------------------------------- Clinic Level of Care Assessment Details Patient Name: [name], [name] 05/10/2015 10:15 Date of Service: AM Medical Record [id number] Number: Patient Account Number: [id number] June 06, 1941 (74 y.o. Treating RN: [name] Date of Birth/Sex: Female) Other Clinician: Primary Care Reyes: [name] Referring Reyes: [name] Reyes/Extender: [name]: 3 Clinic Level of Care Assessment Items TOOL 4 Quantity Score  - Use when only an EandM is performed on FOLLOW-UP visit 0 ASSESSMENTS - Nursing Assessment / Reassessment X - Reassessment of Co-morbidities (includes updates in patient status) 1 10 X - Reassessment  of Adherence to Reyes Plan 1 5 ASSESSMENTS - Wound and Skin Assessment / Reassessment X - Simple Wound Assessment / Reassessment - one wound 1 5  - Complex Wound Assessment / Reassessment - multiple wounds 0  - Dermatologic / Skin Assessment (not related to wound area) 0 ASSESSMENTS - Focused Assessment  - Circumferential Edema Measurements - multi extremities 0  - Nutritional Assessment / Counseling / Intervention 0 X - Lower Extremity Assessment (monofilament, tuning fork, pulses) 1 5  - Peripheral Arterial Disease Assessment (using hand held doppler) 0 ASSESSMENTS - Ostomy and/or Continence Assessment and Care  - Incontinence Assessment and Management 0  - Ostomy Care Assessment and Management (repouching, etc.) 0 PROCESS - Coordination of Care X - Simple Patient / Family Education for ongoing care 1 15  - Complex (extensive) Patient / Family Education for ongoing care 0  - Staff obtains Chiropractor, Records, Test Results / Process Orders 0  - Staff telephones HHA, Nursing Homes / Clarify orders / etc 0 [name], [name] ([id number])  - Routine Transfer to another Facility (non-emergent condition) 0  - Routine Hospital Admission (non-emergent condition) 0  - New Admissions / Manufacturing engineer / Ordering NPWT, Apligraf, etc. 0  - Emergency Hospital Admission (emergent condition) 0 X - Simple Discharge Coordination 1 10  - Complex (extensive) Discharge Coordination 0 PROCESS - Special Needs  - Pediatric / Minor Patient Management 0  - Isolation Patient Management 0  - Hearing / Language / Visual special needs 0  - Assessment of Community assistance (transportation, D/C planning, etc.) 0  - Additional assistance / Altered mentation 0  - Support Surface(s) Assessment (bed, cushion, seat, etc.) 0 INTERVENTIONS - Wound Cleansing / Measurement X - Simple Wound Cleansing - one wound 1 5  - Complex Wound Cleansing - multiple wounds 0 X  - Wound Imaging (photographs - any number of wounds) 1 5  - Wound Tracing (instead of photographs) 0 X - Simple Wound Measurement - one wound 1  5 []  - Complex Wound Measurement - multiple wounds 0 INTERVENTIONS - Wound Dressings X - Small Wound Dressing one or multiple wounds 1 10 []  - Medium Wound Dressing one or multiple wounds 0 []  - Large Wound Dressing one or multiple wounds 0 []  - Application of Medications - topical 0 []  - Application of Medications - injection 0 Depaula, [name] ([id number]) INTERVENTIONS - Miscellaneous []  - External ear exam 0 []  - Specimen Collection (cultures, biopsies, blood, body fluids, etc.) 0 []  - Specimen(s) / Culture(s) sent or taken to Lab for analysis 0 []  - Patient Transfer (multiple staff / Michiel SitesHoyer Lift / Similar devices) 0 []  - Simple Staple / Suture removal (25 or less) 0 []  - Complex Staple / Suture removal (26 or more) 0 []  - Hypo / Hyperglycemic Management (close monitor of Blood Glucose) 0 []  - Ankle / Brachial Index (ABI) - do not check if billed separately 0 X - Vital Signs 1 5 Has the patient been seen at the hospital within the last three years: Yes Total Score: 80 Level Of Care: New/Established - Level 3 Electronic Signature(s) Signed: 05/10/2015 5:44:39 PM By: [name] Entered By: [name] on 05/10/2015 10:39:52 Willcox, Stacie Kathie RhodesS. ([id number]) -------------------------------------------------------------------------------- Encounter Discharge Information Details Patient Name: Stacie CoachBOSWELL, [name] 05/10/2015 10:15 Date of Service: AM Medical Record [id number] Number: Patient Account Number: [id number] 1940/12/16 (74 y.o. Treating RN: [name] Date of Birth/Sex: Female) Other Clinician: Primary Care Reyes: [name] Stacie Britto, [name] Reyes/Extender: [name]: 3 Encounter Discharge Information Items Discharge Pain Level: 0 Discharge  Condition: Stable Ambulatory Status: Wheelchair Discharge Destination: Home Transportation: Private Auto Accompanied By: staff Schedule Follow-up Appointment: Yes Medication Reconciliation completed and provided to Patient/Care No Vanita Cannell: Provided on Clinical Summary of Care: 05/10/2015 Form Type Recipient Paper Patient RB Electronic Signature(s) Signed: 05/10/2015 4:52:13 PM By: [name] Previous Signature: 05/10/2015 10:51:18 AM Version By: Gwenlyn PerkingMoore, Shelia Entered By: [name] on 05/10/2015 16:52:13 Heft, Brocha SMarland Reyes. ([id number]) -------------------------------------------------------------------------------- Lower Extremity Assessment Details Patient Name: Stacie CoachBOSWELL, [name] 05/10/2015 10:15 Date of Service: AM Medical Record [id number] Number: Patient Account Number: [id number] 1940/12/16 (74 y.o. Treating RN: [name] Date of Birth/Sex: Female) Other Clinician: Primary Care Reyes: [name] Stacie Britto, [name] Reyes/Extender: [name]: 3 Vascular Assessment Pulses: Posterior Tibial Dorsalis Pedis Palpable: [Left:Yes] Extremity colors, hair growth, and conditions: Extremity Color: [Left:Normal] Hair Growth on Extremity: [Left:No] Temperature of Extremity: [Left:Warm] Capillary Refill: [Left:< 3 seconds] Toe Nail Assessment Left: Right: Thick: Yes Discolored: No Deformed: Yes Improper Length and Hygiene: No Electronic Signature(s) Signed: 05/10/2015 5:44:39 PM By: [name] Entered By: [name] on 05/10/2015 10:33:10 Lappe, Stacie SMarland Reyes. ([id number]) -------------------------------------------------------------------------------- Multi Wound Chart Details Patient Name: Stacie CoachBOSWELL, [name] 05/10/2015 10:15 Date of Service: AM Medical Record [id number] Number: Patient Account Number: [id number] 1940/12/16 (74 y.o. Treating RN: [name] Date of  Birth/Sex: Female) Other Clinician: Primary Care Reyes: [name] Stacie Britto, [name] Reyes/Extender: [name]: 3 Vital Signs Height(in): Pulse(bpm): 52 Weight(lbs): 125 Blood Pressure 94/60 (mmHg): Body Mass Index(BMI): Temperature(F): Respiratory Rate 16 (breaths/min): Photos: [2:No Photos] [N/A:N/A] Wound Location: [2:Left Toe Great] [N/A:N/A] Wounding Event: [2:Trauma] [N/A:N/A] Primary Etiology: [2:Trauma, Other] [N/A:N/A] Comorbid History: [2:Seizure Disorder] [N/A:N/A] Date Acquired: [2:04/25/2015] [N/A:N/A] Stacie of Reyes: [2:2] [N/A:N/A] Wound Status: [2:Open] [N/A:N/A] Measurements L x W x D 0.3x0.4x0.1 [N/A:N/A] (cm) Area (cm) : [2:0.094] [N/A:N/A] Volume (cm) : [2:0.009] [N/A:N/A] % Reduction in Area: [2:50.00%] [  N/A:N/A] % Reduction in Volume: 76.30% [N/A:N/A] Classification: [2:Full Thickness Without Exposed Support Structures] [N/A:N/A] Exudate Amount: [2:Large] [N/A:N/A] Exudate Type: [2:Sanguinous] [N/A:N/A] Exudate Color: [2:red] [N/A:N/A] Wound Margin: [2:Flat and Intact] [N/A:N/A] Granulation Amount: [2:Large (67-100%)] [N/A:N/A] Necrotic Amount: [2:None Present (0%)] [N/A:N/A] Exposed Structures: [2:Fascia: No Fat: No Tendon: No Muscle: No Joint: No] [N/A:N/A] Bone: No Limited to Skin Breakdown Epithelialization: None N/A N/A Periwound Skin Texture: Edema: No N/A N/A Excoriation: No Induration: No Callus: No Crepitus: No Fluctuance: No Friable: No Rash: No Scarring: No Periwound Skin Maceration: No N/A N/A Moisture: Moist: No Dry/Scaly: No Periwound Skin Color: Atrophie Blanche: No N/A N/A Cyanosis: No Ecchymosis: No Erythema: No Hemosiderin Staining: No Mottled: No Pallor: No Rubor: No Tenderness on Yes N/A N/A Palpation: Wound Preparation: Ulcer Cleansing: N/A N/A Rinsed/Irrigated with Saline Topical Anesthetic Applied: Other:  lidocaine 4% Reyes Notes Electronic Signature(s) Signed: 05/10/2015 5:44:39 PM By: [name] Entered By: [name] on 05/10/2015 10:34:53 Dani, Gean Kathie Reyes ([id number]) -------------------------------------------------------------------------------- Multi-Disciplinary Care Plan Details Patient Name: [name], [name] 05/10/2015 10:15 Date of Service: AM Medical Record [id number] Number: Patient Account Number: [id number] [birth year] (73 y.o. Treating RN: [name] Date of Birth/Sex: Female) Other Clinician: Primary Care Reyes: [name] Referring Reyes: [name] Reyes/Extender: [name] in Reyes: 3 Active Inactive Abuse / Safety / Falls / Self Care Management Nursing Diagnoses: Impaired physical mobility Goals: Patient will remain injury free Date Initiated: 04/18/2015 Goal Status: Active Interventions: Assess fall risk on admission and as needed Notes: Orientation to the Wound Care Program Nursing Diagnoses: Knowledge deficit related to the wound healing center program Goals: Patient/caregiver will verbalize understanding of the Wound Healing Center Program Date Initiated: 04/18/2015 Goal Status: Active Interventions: Provide education on orientation to the wound center Notes: Pressure Nursing Diagnoses: Knowledge deficit related to causes and risk factors for pressure ulcer development Goals: Roebuck, [name] ([id number]) Patient will remain free from development of additional pressure ulcers Date Initiated: 04/18/2015 Goal Status: Active Interventions: Provide education on pressure ulcers Notes: Wound/Skin Impairment Nursing Diagnoses: Impaired tissue integrity Goals: Ulcer/skin breakdown will heal within 14 Stacie Date Initiated: 04/18/2015 Goal Status: Active Interventions: Assess ulceration(s) every visit Notes: Electronic Signature(s) Signed: 05/10/2015 5:44:39 PM By: [name] Entered By: [name] on 05/10/2015 10:34:43 Mentel, [name] ([id number]) -------------------------------------------------------------------------------- Patient/Caregiver Education Details Patient Name: Stacie Coach. 05/10/2015 10:15 Date of Service: AM Medical Record [id number] Number: Patient Account Number: [id number] Oct 03, 1940 (73 y.o. Treating RN: [name] Date of Birth/Gender: Female) Other Clinician: Primary Care Reyes: [name] Referring Reyes: [name] Reyes/Extender: [name] in Reyes: 3 Education Assessment Education Provided To: Patient and Caregiver Education Topics Provided Nutrition: Handouts: Other: nutrition for wound healing Methods: Demonstration, Explain/Verbal Responses: State content correctly Electronic Signature(s) Signed: 05/10/2015 4:52:37 PM By: [name] Entered By: [name] on 05/10/2015 16:52:37 Johns, Stacie SMarland Reyes ([id number]) -------------------------------------------------------------------------------- Wound Assessment Details Patient Name: Stacie Coach. 05/10/2015 10:15 Date of Service: AM Medical Record [id number] Number: Patient Account Number: [id number] 07-Jul-1941 (73 y.o. Treating RN: [name] Date of Birth/Sex: Female) Other Clinician: Primary Care Reyes: [name] Referring Reyes: [name] Reyes/Extender: [name]: 3 Wound Status Wound Number: 2 Primary Etiology: Trauma, Other Wound Location: Left Toe Great Wound Status: Open Wounding Event: Trauma Comorbid History: Seizure Disorder Date Acquired: 04/25/2015 Stacie Of Reyes: 2 Clustered Wound: No Photos Photo Uploaded By: [name] on 05/10/2015 16:54:47 Wound Measurements Length: (cm) 0.3 % Reduction in A Width: (cm)  0.4 % Reduction in V Depth: (cm) 0.1 Epithelializatio Area: (cm)  0.094 Tunneling: Volume: (cm) 0.009 Undermining: rea: 50% olume: 76.3% n: None No No Wound Description Full Thickness Without Exposed Foul Odor After Classification: Support Structures Wound Margin: Flat and Intact Exudate Large Amount: Exudate Type: Sanguinous Exudate Color: red Cleansing: No Wound Bed Granulation Amount: Large (67-100%) Exposed Structure [name], [name] ([id number]) Necrotic Amount: None Present (0%) Fascia Exposed: No Fat Layer Exposed: No Tendon Exposed: No Muscle Exposed: No Joint Exposed: No Bone Exposed: No Limited to Skin Breakdown Periwound Skin Texture Texture Color No Abnormalities Noted: No No Abnormalities Noted: No Callus: No Atrophie Blanche: No Crepitus: No Cyanosis: No Excoriation: No Ecchymosis: No Fluctuance: No Erythema: No Friable: No Hemosiderin Staining: No Induration: No Mottled: No Localized Edema: No Pallor: No Rash: No Rubor: No Scarring: No Temperature / Pain Moisture Tenderness on Palpation: Yes No Abnormalities Noted: No Dry / Scaly: No Maceration: No Moist: No Wound Preparation Ulcer Cleansing: Rinsed/Irrigated with Saline Topical Anesthetic Applied: Other: lidocaine 4%, Reyes Notes Wound #2 (Left Toe Great) 1. Cleansed with: Clean wound with Normal Saline 2. Anesthetic Topical Lidocaine 4% cream to wound bed prior to debridement 4. Dressing Applied: Prisma Ag 5. Secondary Dressing Applied Gauze and Kerlix/Conform 7. Secured with Secretary/administrator) Signed: 05/10/2015 5:44:39 PM By: [name] Entered By: [name] on 05/10/2015 10:33:44 Sardinas, Patrisia S. ([id number]) Carrithers, Aryia SMarland Reyes ([id number]) -------------------------------------------------------------------------------- Vitals Details Patient Name: Stacie Coach. 05/10/2015 10:15 Date of Service: AM Medical Record [id number] Number: Patient Account Number: [id number] [birth year] (73 y.o.  Treating RN: [name] Date of Birth/Sex: Female) Other Clinician: Primary Care Reyes: [name] Referring Reyes: [name] Reyes/Extender: [name]: 3 Vital Signs Time Taken: 10:26 Pulse (bpm): 52 Weight (lbs): 125 Respiratory Rate (breaths/min): 16 Blood Pressure (mmHg): 94/60 Reference Range: 80 - 120 mg / dl Electronic Signature(s) Signed: 05/10/2015 5:44:39 PM By: [name] Entered By: [name] on 05/10/2015 36:64:40

## 2015-05-11 NOTE — Progress Notes (Signed)
SEBASTIANA, WUEST ([id number]) Visit Report for 05/10/2015 Chief Complaint Document Details Patient Name: DAVANEE, KLINKNER 05/10/2015 10:15 Date of Service: AM Medical Record [id number] Number: Patient Account Number: [id number] February 21, 1941 (74 y.o. Treating RN: Curtis Sites Date of Birth/Sex: Female) Other Clinician: Primary Care Treating Feldpausch, Lanetta Inch, Kristen Fromm Physician: Physician/Extender: Referring Physician: Kandis Cocking in Treatment: 3 Information Obtained from: Caregiver Chief Complaint Wound on the left second toe due to deformed foot causing pressure for about 2 weeks. Electronic Signature(s) Signed: 05/10/2015 10:40:57 AM By: Evlyn Kanner MD, FACS Entered By: Evlyn Kanner on 05/10/2015 10:40:57 [name] ([id number]) -------------------------------------------------------------------------------- HPI Details Patient Name: [name] 05/10/2015 10:15 Date of Service: AM Medical Record [id number] Number: Patient Account Number: [id number] 1941/04/17 (73 y.o. Treating RN: Curtis Sites Date of Birth/Sex: Female) Other Clinician: Primary Care Treating Feldpausch, Lanetta Inch, Anterrio Mccleery Physician: Physician/Extender: Referring Physician: Kandis Cocking in Treatment: 3 History of Present Illness Location: left second toe dorsum Quality: Patient reports No Pain. Severity: Patient states wound (s) are getting better. Duration: Patient has had the wound for < 2 weeks prior to presenting for treatment Timing: Pain in wound is Intermittent (comes and goes Context: The wound appeared gradually over time Modifying Factors: Consults to this date include: has been on Keflex for about 10 days Associated Signs and Symptoms: is wheelchair-bound HPI Description: 74 year old patient who has a history of spina bifida, cognitive disability was seen by Dr. Filbert Schilder for a left second toe ulceration about 2 weeks ago. The patient  has a past medical history of spina bifida, seizures, schizophrenia, depression, mental retardation. She has never been a smoker and has had no surgery. x-rays of the left foot were done which showed soft tissue swelling but no erosion involving the phalanx. With a diagnosis of cellulitis of the toe of the left foot the patient was placed on Keflex which was started on 04/09/2015 05/03/2015 -- while trying to clip her toenails which were rather long she had a superficial injury to the left big toe which is now an ulcer. She is yet to see a podiatrist. Electronic Signature(s) Signed: 05/10/2015 10:41:02 AM By: Evlyn Kanner MD, FACS Entered By: Evlyn Kanner on 05/10/2015 10:41:02 [name] ([id number]) -------------------------------------------------------------------------------- Physical Exam Details Patient Name: [name] 05/10/2015 10:15 Date of Service: AM Medical Record [id number] Number: Patient Account Number: [id number] 1940/07/25 (73 y.o. Treating RN: Curtis Sites Date of Birth/Sex: Female) Other Clinician: Primary Care Treating Feldpausch, Lanetta Inch, Bettyann Birchler Physician: Physician/Extender: Referring Physician: Kandis Cocking in Treatment: 3 Constitutional . Pulse regular. Respirations normal and unlabored. Afebrile. . Eyes Nonicteric. Reactive to light. Ears, Nose, Mouth, and Throat Lips, teeth, and gums WNL.Marland Kitchen Moist mucosa without lesions . Neck supple and nontender. No palpable supraclavicular or cervical adenopathy. Normal sized without goiter. Respiratory WNL. No retractions.. Cardiovascular Pedal Pulses WNL. No clubbing, cyanosis or edema. Lymphatic No adneopathy. No adenopathy. No adenopathy. Musculoskeletal Adexa without tenderness or enlargement.. Digits and nails w/o clubbing, cyanosis, infection, petechiae, ischemia, or inflammatory conditions.. Integumentary (Hair, Skin) No suspicious lesions. No crepitus or fluctuance.  No peri-wound warmth or erythema. No masses.Marland Kitchen Psychiatric Judgement and insight Intact.. No evidence of depression, anxiety, or agitation.. Notes the wound on the left big toe medially is looking better and we will continue with local care. Electronic Signature(s) Signed: 05/10/2015 10:41:36 AM By: Evlyn Kanner MD, FACS Entered By: Evlyn Kanner on 05/10/2015 10:41:35 [name] ([id number]) -------------------------------------------------------------------------------- Physician Orders Details Patient Name: [name] 05/10/2015  10:15 Date of Service: AM Medical Record [id number] Number: Patient Account Number: [id number] 04/21/1941 (73 y.o. Treating RN: Curtis Sitesorthy, Joanna Date of Birth/Sex: Female) Other Clinician: Primary Care Treating Feldpausch, Lanetta Inchale Riyaan Heroux Physician: Physician/Extender: Referring Physician: Kandis CockingFeldpausch, Dale Weeks in Treatment: 3 Verbal / Phone Orders: Yes Clinician: Curtis Sitesorthy, Joanna Read Back and Verified: Yes Diagnosis Coding Wound Cleansing Wound #2 Left Toe Great o Clean wound with Normal Saline. Anesthetic Wound #2 Left Toe Great o Topical Lidocaine 4% cream applied to wound bed prior to debridement Primary Wound Dressing Wound #2 Left Toe Great o Prisma Ag Secondary Dressing Wound #2 Left Toe Great o Gauze and Kerlix/Conform Dressing Change Frequency Wound #2 Left Toe Great o Change dressing every other day. Follow-up Appointments Wound #2 Left Toe Great o Return Appointment in 1 week. Electronic Signature(s) Signed: 05/10/2015 1:38:48 PM By: Evlyn KannerBritto, Jaton Eilers MD, FACS Signed: 05/10/2015 5:44:39 PM By: Curtis Sitesorthy, Joanna Entered By: Curtis Sitesorthy, Joanna on 05/10/2015 10:39:04 Quizhpi, Anikka Kathie RhodesS. ([id number]) -------------------------------------------------------------------------------- Problem List Details Patient Name: Iona CoachBOSWELL, Evalyse S. 05/10/2015 10:15 Date of Service: AM Medical  Record [id number] Number: Patient Account Number: [id number] 04/21/1941 (73 y.o. Treating RN: Curtis Sitesorthy, Joanna Date of Birth/Sex: Female) Other Clinician: Primary Care Treating Feldpausch, Lanetta Inchale Korion Cuevas Physician: Physician/Extender: Referring Physician: Kandis CockingFeldpausch, Dale Weeks in Treatment: 3 Active Problems ICD-10 Encounter Code Description Active Date Diagnosis L89.893 Pressure ulcer of other site, stage 3 04/18/2015 Yes S91.105A Unspecified open wound of left lesser toe(s) without 04/18/2015 Yes damage to nail, initial encounter Q05.9 Spina bifida, unspecified 04/18/2015 Yes F99 Mental disorder, not otherwise specified 04/18/2015 Yes Inactive Problems Resolved Problems Electronic Signature(s) Signed: 05/10/2015 10:40:50 AM By: Evlyn KannerBritto, Larya Charpentier MD, FACS Entered By: Evlyn KannerBritto, Takelia Urieta on 05/10/2015 10:40:50 Helm, Chesley Kathie RhodesS. ([id number]) -------------------------------------------------------------------------------- Progress Note Details Patient Name: Iona CoachBOSWELL, Anglea S. 05/10/2015 10:15 Date of Service: AM Medical Record [id number] Number: Patient Account Number: [id number] 04/21/1941 (73 y.o. Treating RN: Curtis Sitesorthy, Joanna Date of Birth/Sex: Female) Other Clinician: Primary Care Treating Feldpausch, Lanetta Inchale Corie Allis Physician: Physician/Extender: Referring Physician: Kandis CockingFeldpausch, Dale Weeks in Treatment: 3 Subjective Chief Complaint Information obtained from Caregiver Wound on the left second toe due to deformed foot causing pressure for about 2 weeks. History of Present Illness (HPI) The following HPI elements were documented for the patient's wound: Location: left second toe dorsum Quality: Patient reports No Pain. Severity: Patient states wound (s) are getting better. Duration: Patient has had the wound for < 2 weeks prior to presenting for treatment Timing: Pain in wound is Intermittent (comes and goes Context: The wound appeared gradually over time Modifying  Factors: Consults to this date include: has been on Keflex for about 10 days Associated Signs and Symptoms: is wheelchair-bound 74 year old patient who has a history of spina bifida, cognitive disability was seen by Dr. Filbert Schilderrlando Conti for a left second toe ulceration about 2 weeks ago. The patient has a past medical history of spina bifida, seizures, schizophrenia, depression, mental retardation. She has never been a smoker and has had no surgery. x-rays of the left foot were done which showed soft tissue swelling but no erosion involving the phalanx. With a diagnosis of cellulitis of the toe of the left foot the patient was placed on Keflex which was started on 04/09/2015 05/03/2015 -- while trying to clip her toenails which were rather long she had a superficial injury to the left big toe which is now an ulcer. She is yet to see a podiatrist. Objective Constitutional Pulse regular. Respirations normal and unlabored. Afebrile. Trudo, Jazzmyne S. ([id number]) Vitals  Time Taken: 10:26 AM, Weight: 125 lbs, Pulse: 52 bpm, Respiratory Rate: 16 breaths/min, Blood Pressure: 94/60 mmHg. Eyes Nonicteric. Reactive to light. Ears, Nose, Mouth, and Throat Lips, teeth, and gums WNL.Marland Kitchen Moist mucosa without lesions . Neck supple and nontender. No palpable supraclavicular or cervical adenopathy. Normal sized without goiter. Respiratory WNL. No retractions.. Cardiovascular Pedal Pulses WNL. No clubbing, cyanosis or edema. Lymphatic No adneopathy. No adenopathy. No adenopathy. Musculoskeletal Adexa without tenderness or enlargement.. Digits and nails w/o clubbing, cyanosis, infection, petechiae, ischemia, or inflammatory conditions.Marland Kitchen Psychiatric Judgement and insight Intact.. No evidence of depression, anxiety, or agitation.. General Notes: the wound on the left big toe medially is looking better and we will continue with local care. Integumentary (Hair, Skin) No suspicious lesions. No crepitus or  fluctuance. No peri-wound warmth or erythema. No masses.. Wound #2 status is Open. Original cause of wound was Trauma. The wound is located on the Left Toe Great. The wound measures 0.3cm length x 0.4cm width x 0.1cm depth; 0.094cm^2 area and 0.009cm^3 volume. The wound is limited to skin breakdown. There is no tunneling or undermining noted. There is a large amount of sanguinous drainage noted. The wound margin is flat and intact. There is large (67-100%) granulation within the wound bed. There is no necrotic tissue within the wound bed. The periwound skin appearance did not exhibit: Callus, Crepitus, Excoriation, Fluctuance, Friable, Induration, Localized Edema, Rash, Scarring, Dry/Scaly, Maceration, Moist, Atrophie Blanche, Cyanosis, Ecchymosis, Hemosiderin Staining, Mottled, Pallor, Rubor, Erythema. The periwound has tenderness on palpation. Assessment Tinnel, Exie S. ([id number]) Active Problems ICD-10 L89.893 - Pressure ulcer of other site, stage 3 S91.105A - Unspecified open wound of left lesser toe(s) without damage to nail, initial encounter Q05.9 - Spina bifida, unspecified F99 - Mental disorder, not otherwise specified The patient will continue with Prisma Ag over the wound and we will also await her appointment with the podiatrist to cut the rest of her toenails. Plan Wound Cleansing: Wound #2 Left Toe Great: Clean wound with Normal Saline. Anesthetic: Wound #2 Left Toe Great: Topical Lidocaine 4% cream applied to wound bed prior to debridement Primary Wound Dressing: Wound #2 Left Toe Great: Prisma Ag Secondary Dressing: Wound #2 Left Toe Great: Gauze and Kerlix/Conform Dressing Change Frequency: Wound #2 Left Toe Great: Change dressing every other day. Follow-up Appointments: Wound #2 Left Toe Great: Return Appointment in 1 week. The patient will continue with Prisma Ag over the wound and we will also await her appointment with the podiatrist to cut the rest of  her toenails.we will see her back next week. Electronic Signature(s) Signed: 05/10/2015 10:42:26 AM By: Evlyn Kanner MD, FACS Mcilwain, Ylonda SMarland Kitchen ([id number]) Entered By: Evlyn Kanner on 05/10/2015 10:42:26 Junker, [place] SMarland Kitchen ([id number]) -------------------------------------------------------------------------------- SuperBill Details Patient Name: [name] Date of Service: 05/10/2015 Medical Record Number: [id number] Patient Account Number: [id number] Date of Birth/Sex: 02-Feb-1941 (74 y.o. Female) Treating RN: Curtis Sites Primary Care Physician: Maudie Flakes Other Clinician: Referring Physician: Maudie Flakes Treating Physician/Extender: Rudene Re in Treatment: 3 Diagnosis Coding ICD-10 Codes Code Description [phone] Pressure ulcer of other site, stage 3 S91.105A Unspecified open wound of left lesser toe(s) without damage to nail, initial encounter Q05.9 Spina bifida, unspecified F99 Mental disorder, not otherwise specified Facility Procedures CPT4 Code: [id number] Description: 99213 - WOUND CARE VISIT-LEV 3 EST PT Modifier: Quantity: 1 Physician Procedures CPT4: Description Modifier Quantity Code [id number] 99213 - WC PHYS LEVEL 3 - EST PT 1 ICD-10 Description Diagnosis L89.893 Pressure ulcer of other site, stage 3  S91.105A Unspecified open wound of left lesser toe(s) without damage to nail, initial encounter  Q05.9 Spina bifida, unspecified F99 Mental disorder, not otherwise specified Electronic Signature(s) Signed: 05/10/2015 10:42:43 AM By: Evlyn Kanner MD, FACS Entered By: Evlyn Kanner on 05/10/2015 10:42:43

## 2015-05-19 ENCOUNTER — Encounter: Payer: Medicare Other | Attending: Surgery | Admitting: Surgery

## 2015-05-19 DIAGNOSIS — S91105A Unspecified open wound of left lesser toe(s) without damage to nail, initial encounter: Secondary | ICD-10-CM | POA: Insufficient documentation

## 2015-05-19 DIAGNOSIS — Q059 Spina bifida, unspecified: Secondary | ICD-10-CM | POA: Diagnosis not present

## 2015-05-19 DIAGNOSIS — F99 Mental disorder, not otherwise specified: Secondary | ICD-10-CM | POA: Diagnosis not present

## 2015-05-19 DIAGNOSIS — X58XXXA Exposure to other specified factors, initial encounter: Secondary | ICD-10-CM | POA: Diagnosis not present

## 2015-05-19 DIAGNOSIS — L89893 Pressure ulcer of other site, stage 3: Secondary | ICD-10-CM | POA: Diagnosis present

## 2015-05-20 NOTE — Progress Notes (Addendum)
Stacie, Reyes ([id number]) Visit Report for 05/19/2015 Arrival Information Details Patient Name: [name], [name] Date of Service: 05/19/2015 11:00 AM Medical Record Number: [id number] Patient Account Number: [id number] Date of Birth/Sex: 07-12-1941 (74 y.o. Female) Treating RN: Huel Coventry Primary Care Physician: Maudie Flakes Other Clinician: Referring Physician: Maudie Flakes Treating Physician/Extender: Rudene Re in Treatment: 4 Visit Information History Since Last Visit Added or deleted any medications: No Patient Arrived: Wheel Chair Any new allergies or adverse reactions: No Arrival Time: 11:03 Had a fall or experienced change in No activities of daily living that may affect Accompanied By: caregivers risk of falls: Transfer Assistance: None Signs or symptoms of abuse/neglect since last No Patient Identification Verified: Yes visito Secondary Verification Process Yes Hospitalized since last visit: No Completed: Has Dressing in Place as Prescribed: Yes Pain Present Now: No Electronic Signature(s) Signed: 05/19/2015 4:59:44 PM By: Elliot Gurney, RN, BSN, Kim RN, BSN Entered By: Elliot Gurney, RN, BSN, Kim on 05/19/2015 11:04:16 Narang, Madgie S. ([id number]) -------------------------------------------------------------------------------- Clinic Level of Care Assessment Details Patient Name: Stacie Greaser, [name] Date of Service: 05/19/2015 11:00 AM Medical Record Number: [id number] Patient Account Number: [id number] Date of Birth/Sex: Feb 10, 1941 (74 y.o. Female) Treating RN: Huel Coventry Primary Care Physician: Maudie Flakes Other Clinician: Referring Physician: Maudie Flakes Treating Physician/Extender: Rudene Re in Treatment: 4 Clinic Level of Care Assessment Items TOOL 4 Quantity Score  - Use when only an EandM is performed on FOLLOW-UP visit 0 ASSESSMENTS - Nursing Assessment / Reassessment  - Reassessment of Co-morbidities (includes  updates in patient status) 0 X - Reassessment of Adherence to Treatment Plan 1 5 ASSESSMENTS - Wound and Skin Assessment / Reassessment X - Simple Wound Assessment / Reassessment - one wound 1 5  - Complex Wound Assessment / Reassessment - multiple wounds 0  - Dermatologic / Skin Assessment (not related to wound area) 0 ASSESSMENTS - Focused Assessment  - Circumferential Edema Measurements - multi extremities 0  - Nutritional Assessment / Counseling / Intervention 0  - Lower Extremity Assessment (monofilament, tuning fork, pulses) 0  - Peripheral Arterial Disease Assessment (using hand held doppler) 0 ASSESSMENTS - Ostomy and/or Continence Assessment and Care  - Incontinence Assessment and Management 0  - Ostomy Care Assessment and Management (repouching, etc.) 0 PROCESS - Coordination of Care X - Simple Patient / Family Education for ongoing care 1 15  - Complex (extensive) Patient / Family Education for ongoing care 0  - Staff obtains Chiropractor, Records, Test Results / Process Orders 0  - Staff telephones HHA, Nursing Homes / Clarify orders / etc 0  - Routine Transfer to another Facility (non-emergent condition) 0 Coakley, Edye S. ([id number])  - Routine Hospital Admission (non-emergent condition) 0  - New Admissions / Manufacturing engineer / Ordering NPWT, Apligraf, etc. 0  - Emergency Hospital Admission (emergent condition) 0 X - Simple Discharge Coordination 1 10  - Complex (extensive) Discharge Coordination 0 PROCESS - Special Needs  - Pediatric / Minor Patient Management 0  - Isolation Patient Management 0  - Hearing / Language / Visual special needs 0  - Assessment of Community assistance (transportation, D/C planning, etc.) 0  - Additional assistance / Altered mentation 0  - Support Surface(s) Assessment (bed, cushion, seat, etc.) 0 INTERVENTIONS - Wound Cleansing / Measurement X - Simple Wound Cleansing - one wound 1 5  -  Complex Wound Cleansing - multiple wounds 0 X - Wound Imaging (photographs - any number of wounds) 1 5  - Wound Tracing (instead of  photographs) 0 X - Simple Wound Measurement - one wound 1 5 []  - Complex Wound Measurement - multiple wounds 0 INTERVENTIONS - Wound Dressings X - Small Wound Dressing one or multiple wounds 1 10 []  - Medium Wound Dressing one or multiple wounds 0 []  - Large Wound Dressing one or multiple wounds 0 []  - Application of Medications - topical 0 []  - Application of Medications - injection 0 INTERVENTIONS - Miscellaneous []  - External ear exam 0 Deignan, Kyler S. ([id number]) []  - Specimen Collection (cultures, biopsies, blood, body fluids, etc.) 0 []  - Specimen(s) / Culture(s) sent or taken to Lab for analysis 0 []  - Patient Transfer (multiple staff / Nurse, adult / Similar devices) 0 []  - Simple Staple / Suture removal (25 or less) 0 []  - Complex Staple / Suture removal (26 or more) 0 []  - Hypo / Hyperglycemic Management (close monitor of Blood Glucose) 0 []  - Ankle / Brachial Index (ABI) - do not check if billed separately 0 X - Vital Signs 1 5 Has the patient been seen at the hospital within the last three years: Yes Total Score: 65 Level Of Care: New/Established - Level 2 Electronic Signature(s) Signed: 05/19/2015 4:59:44 PM By: Elliot Gurney, RN, BSN, Kim RN, BSN Entered By: Elliot Gurney, RN, BSN, Kim on 05/19/2015 11:15:29 Deboy, Graycen S. ([id number]) -------------------------------------------------------------------------------- Encounter Discharge Information Details Patient Name: Stacie Mile S. Date of Service: 05/19/2015 11:00 AM Medical Record Number: [id number] Patient Account Number: [id number] Date of Birth/Sex: 06/02/1941 (74 y.o. Female) Treating RN: Huel Coventry Primary Care Physician: Maudie Flakes Other Clinician: Referring Physician: Maudie Flakes Treating Physician/Extender: Rudene Re in Treatment: 4 Encounter Discharge  Information Items Discharge Pain Level: 0 Discharge Condition: Stable Ambulatory Status: Wheelchair Discharge Destination: Nursing Home Transportation: Private Auto Accompanied By: caregivers Schedule Follow-up Appointment: Yes Medication Reconciliation completed and provided to Patient/Care Yes Umeka Wrench: Provided on Clinical Summary of Care: 05/19/2015 Form Type Recipient Paper Patient RB Electronic Signature(s) Signed: 05/19/2015 4:59:44 PM By: Elliot Gurney RN, BSN, Kim RN, BSN Previous Signature: 05/19/2015 11:22:10 AM Version By: Gwenlyn Perking Entered By: Elliot Gurney RN, BSN, Kim on 05/19/2015 11:24:19 Mendia, Jaunita S. ([id number]) -------------------------------------------------------------------------------- Lower Extremity Assessment Details Patient Name: Stacie Greaser, Lexxie S. Date of Service: 05/19/2015 11:00 AM Medical Record Number: [id number] Patient Account Number: [id number] Date of Birth/Sex: 1941/06/11 (73 y.o. Female) Treating RN: Huel Coventry Primary Care Physician: Maudie Flakes Other Clinician: Referring Physician: Maudie Flakes Treating Physician/Extender: Rudene Re in Treatment: 4 Vascular Assessment Pulses: Posterior Tibial Dorsalis Pedis Palpable: [Left:Yes] Extremity colors, hair growth, and conditions: Extremity Color: [Left:Normal] Hair Growth on Extremity: [Left:Yes] Temperature of Extremity: [Left:Warm] Toe Nail Assessment Left: Right: Thick: No Discolored: No Deformed: No Electronic Signature(s) Signed: 05/19/2015 4:59:44 PM By: Elliot Gurney, RN, BSN, Kim RN, BSN Entered By: Elliot Gurney, RN, BSN, Kim on 05/19/2015 11:09:09 Teague, Fayola S. ([id number]) -------------------------------------------------------------------------------- Multi Wound Chart Details Patient Name: Stacie Mile S. Date of Service: 05/19/2015 11:00 AM Medical Record Number: [id number] Patient Account Number: [id number] Date of Birth/Sex: 30-Sep-1940 (73 y.o.  Female) Treating RN: Huel Coventry Primary Care Physician: Maudie Flakes Other Clinician: Referring Physician: Maudie Flakes Treating Physician/Extender: Rudene Re in Treatment: 4 Vital Signs Height(in): Pulse(bpm): 65 Weight(lbs): 125 Blood Pressure 132/76 (mmHg): Body Mass Index(BMI): Temperature(F): Respiratory Rate 18 (breaths/min): Photos: [2:No Photos] [N/A:N/A] Wound Location: [2:Left Toe Great] [N/A:N/A] Wounding Event: [2:Trauma] [N/A:N/A] Primary Etiology: [2:Trauma, Other] [N/A:N/A] Comorbid History: [2:Seizure Disorder] [N/A:N/A] Date Acquired: [2:04/25/2015] [N/A:N/A] Weeks of Treatment: [2:3] [N/A:N/A] Wound Status: [2:Open] [N/A:N/A] Measurements L x W  x D 0.3x0.4x0.1 [N/A:N/A] (cm) Area (cm) : [2:0.094] [N/A:N/A] Volume (cm) : [2:0.009] [N/A:N/A] % Reduction in Area: [2:50.00%] [N/A:N/A] % Reduction in Volume: 76.30% [N/A:N/A] Classification: [2:Full Thickness Without Exposed Support Structures] [N/A:N/A] Exudate Amount: [2:Large] [N/A:N/A] Exudate Type: [2:Sanguinous] [N/A:N/A] Exudate Color: [2:red] [N/A:N/A] Wound Margin: [2:Flat and Intact] [N/A:N/A] Granulation Amount: [2:None Present (0%)] [N/A:N/A] Necrotic Amount: [2:Large (67-100%)] [N/A:N/A] Necrotic Tissue: [2:Eschar] [N/A:N/A] Exposed Structures: [2:Fascia: No Fat: No Tendon: No Muscle: No Joint: No Bone: No] [N/A:N/A] Limited to Skin Breakdown Epithelialization: None N/A N/A Periwound Skin Texture: Edema: No N/A N/A Excoriation: No Induration: No Callus: No Crepitus: No Fluctuance: No Friable: No Rash: No Scarring: No Periwound Skin Maceration: No N/A N/A Moisture: Moist: No Dry/Scaly: No Periwound Skin Color: Atrophie Blanche: No N/A N/A Cyanosis: No Ecchymosis: No Erythema: No Hemosiderin Staining: No Mottled: No Pallor: No Rubor: No Tenderness on Yes N/A N/A Palpation: Wound Preparation: Ulcer Cleansing: N/A N/A Rinsed/Irrigated  with Saline Topical Anesthetic Applied: Other: lidocaine 4% Treatment Notes Electronic Signature(s) Signed: 05/19/2015 4:59:44 PM By: Elliot Gurney, RN, BSN, Kim RN, BSN Entered By: Elliot Gurney, RN, BSN, Kim on 05/19/2015 11:09:53 Mkrtchyan, Ravan Kathie Rhodes ([id number]) -------------------------------------------------------------------------------- Multi-Disciplinary Care Plan Details Patient Name: Stacie Mile S. Date of Service: 05/19/2015 11:00 AM Medical Record Number: [id number] Patient Account Number: [id number] Date of Birth/Sex: 1941/01/03 (73 y.o. Female) Treating RN: Huel Coventry Primary Care Physician: Maudie Flakes Other Clinician: Referring Physician: Maudie Flakes Treating Physician/Extender: Rudene Re in Treatment: 4 Active Inactive Electronic Signature(s) Signed: 05/20/2015 4:42:28 PM By: Elliot Gurney RN, BSN, Kim RN, BSN Previous Signature: 05/19/2015 4:59:44 PM Version By: Elliot Gurney RN, BSN, Kim RN, BSN Entered By: Elliot Gurney, RN, BSN, Kim on 05/20/2015 16:35:49 Mickle, Camia S. ([id number]) -------------------------------------------------------------------------------- Pain Assessment Details Patient Name: Stacie Greaser, Yanessa S. Date of Service: 05/19/2015 11:00 AM Medical Record Number: [id number] Patient Account Number: [id number] Date of Birth/Sex: October 01, 1940 (73 y.o. Female) Treating RN: Huel Coventry Primary Care Physician: Maudie Flakes Other Clinician: Referring Physician: Maudie Flakes Treating Physician/Extender: Rudene Re in Treatment: 4 Active Problems Location of Pain Severity and Description of Pain Patient Has Paino Patient Unable to Respond Site Locations Pain Management and Medication Current Pain Management: Electronic Signature(s) Signed: 05/19/2015 4:59:44 PM By: Elliot Gurney, RN, BSN, Kim RN, BSN Entered By: Elliot Gurney, RN, BSN, Kim on 05/19/2015 11:04:23 Mccrackin, Octaviano Batty  ([id number]) -------------------------------------------------------------------------------- Patient/Caregiver Education Details Patient Name: [name] Date of Service: 05/19/2015 11:00 AM Medical Record Number: [id number] Patient Account Number: [id number] Date of Birth/Gender: 1940-10-07 (73 y.o. Female) Treating RN: Huel Coventry Primary Care Physician: Maudie Flakes Other Clinician: Referring Physician: Maudie Flakes Treating Physician/Extender: Rudene Re in Treatment: 4 Education Assessment Education Provided To: Patient Education Topics Provided Wound/Skin Impairment: Handouts: Caring for Your Ulcer, Other: paint scab with betadine until it falls off. Methods: Demonstration, Explain/Verbal, Printed Responses: State content correctly Electronic Signature(s) Signed: 05/19/2015 4:59:44 PM By: Elliot Gurney, RN, BSN, Kim RN, BSN Entered By: Elliot Gurney, RN, BSN, Kim on 05/19/2015 11:24:52 Maslowski, Maysie S. ([id number]) -------------------------------------------------------------------------------- Wound Assessment Details Patient Name: Stacie Greaser, Anahy S. Date of Service: 05/19/2015 11:00 AM Medical Record Number: [id number] Patient Account Number: [id number] Date of Birth/Sex: 1941/05/05 (73 y.o. Female) Treating RN: Huel Coventry Primary Care Physician: Maudie Flakes Other Clinician: Referring Physician: Maudie Flakes Treating Physician/Extender: Rudene Re in Treatment: 4 Wound Status Wound Number: 2 Primary Etiology: Trauma, Other Wound Location: Left Toe Great Wound Status: Open Wounding Event: Trauma Comorbid History: Seizure Disorder Date Acquired: 04/25/2015 Weeks Of Treatment: 3 Clustered Wound: No Photos Photo  Uploaded By: Elliot GurneyWoody, RN, BSN, Kim on 05/19/2015 17:26:44 Wound Measurements Length: (cm) 0 % Reduction in Width: (cm) 0 % Reduction in Depth: (cm) 0 Epithelializati Area: (cm) 0 Volume: (cm) 0 Area: 100% Volume:  100% on: None Wound Description Full Thickness Without Exposed Classification: Support Structures Wound Margin: Flat and Intact Exudate Large Amount: Exudate Type: Sanguinous Exudate Color: red Foul Odor After Cleansing: No Wound Bed Granulation Amount: None Present (0%) Exposed Structure Necrotic Amount: Large (67-100%) Fascia Exposed: No Necrotic Quality: Eschar Fat Layer Exposed: No Rhymes, Mical S. ([id number]) Tendon Exposed: No Muscle Exposed: No Joint Exposed: No Bone Exposed: No Limited to Skin Breakdown Periwound Skin Texture Texture Color No Abnormalities Noted: No No Abnormalities Noted: No Callus: No Atrophie Blanche: No Crepitus: No Cyanosis: No Excoriation: No Ecchymosis: No Fluctuance: No Erythema: No Friable: No Hemosiderin Staining: No Induration: No Mottled: No Localized Edema: No Pallor: No Rash: No Rubor: No Scarring: No Temperature / Pain Moisture Tenderness on Palpation: Yes No Abnormalities Noted: No Dry / Scaly: No Maceration: No Moist: No Wound Preparation Ulcer Cleansing: Rinsed/Irrigated with Saline Topical Anesthetic Applied: Other: lidocaine 4%, Electronic Signature(s) Signed: 05/19/2015 4:59:44 PM By: Elliot GurneyWoody, RN, BSN, Kim RN, BSN Entered By: Elliot GurneyWoody, RN, BSN, Kim on 05/19/2015 11:14:42 Kochanski, Makynna SMarland Kitchen. ([id number]) -------------------------------------------------------------------------------- Vitals Details Patient Name: Iona CoachBOSWELL, Lidya S. Date of Service: 05/19/2015 11:00 AM Medical Record Number: [id number] Patient Account Number: [id number] Date of Birth/Sex: 01-13-1941 (73 y.o. Female) Treating RN: Huel CoventryWoody, Kim Primary Care Physician: Maudie FlakesFeldpausch, Dale Other Clinician: Referring Physician: Maudie FlakesFeldpausch, Dale Treating Physician/Extender: Rudene ReBritto, Errol Weeks in Treatment: 4 Vital Signs Time Taken: 11:04 Pulse (bpm): 65 Weight (lbs): 125 Respiratory Rate (breaths/min): 18 Blood Pressure (mmHg): 132/76 Reference  Range: 80 - 120 mg / dl Electronic Signature(s) Signed: 05/19/2015 4:59:44 PM By: Elliot GurneyWoody, RN, BSN, Kim RN, BSN Entered By: Elliot GurneyWoody, RN, BSN, Kim on 05/19/2015 11:04:41

## 2015-05-20 NOTE — Progress Notes (Signed)
[name], Chrissy S. ([id number]) Visit Report for 05/19/2015 Chief Complaint Document Details Patient Name: [name], [name] 05/19/2015 11:00 Date of Service: AM Medical Record [id number] Number: Patient Account Number: [id number] 1941/06/05 (74 y.o. Treating RN: Huel CoventryWoody, Kim Date of Birth/Sex: Female) Other Clinician: Primary Care Physician: Maudie FlakesFeldpausch, Dale Treating Evlyn KannerBritto, Amanat Hackel Referring Physician: Maudie FlakesFeldpausch, Dale Physician/Extender: Tania AdeWeeks in Treatment: 4 Information Obtained from: Caregiver Chief Complaint Wound on the left second toe due to deformed foot causing pressure for about 2 weeks. Electronic Signature(s) Signed: 05/19/2015 11:17:01 AM By: Evlyn KannerBritto, Graciela Plato MD, FACS Entered By: Evlyn KannerBritto, Ingri Diemer on 05/19/2015 11:17:00 [name], [name] ([id number]) -------------------------------------------------------------------------------- HPI Details Patient Name: [name], [name] 05/19/2015 11:00 Date of Service: AM Medical Record [id number] Number: Patient Account Number: [id number] 1941/06/05 (74 y.o. Treating RN: Huel CoventryWoody, Kim Date of Birth/Sex: Female) Other Clinician: Primary Care Physician: Maudie FlakesFeldpausch, Dale Treating Evlyn KannerBritto, Shaivi Rothschild Referring Physician: Maudie FlakesFeldpausch, Dale Physician/Extender: Tania AdeWeeks in Treatment: 4 History of Present Illness Location: left second toe dorsum Quality: Patient reports No Pain. Severity: Patient states wound (s) are getting better. Duration: Patient has had the wound for < 2 weeks prior to presenting for treatment Timing: Pain in wound is Intermittent (comes and goes Context: The wound appeared gradually over time Modifying Factors: Consults to this date include: has been on Keflex for about 10 days Associated Signs and Symptoms: is wheelchair-bound HPI Description: 74 year old patient who has a history of spina bifida, cognitive disability was seen by Dr. Filbert Schilderrlando Conti for a left second toe ulceration about 2 weeks ago. The patient has a past  medical history of spina bifida, seizures, schizophrenia, depression, mental retardation. She has never been a smoker and has had no surgery. x-rays of the left foot were done which showed soft tissue swelling but no erosion involving the phalanx. With a diagnosis of cellulitis of the toe of the left foot the patient was placed on Keflex which was started on 04/09/2015 05/03/2015 -- while trying to clip her toenails which were rather long she had a superficial injury to the left big toe which is now an ulcer. She is yet to see a podiatrist. Electronic Signature(s) Signed: 05/19/2015 11:17:07 AM By: Evlyn KannerBritto, Konner Saiz MD, FACS Entered By: Evlyn KannerBritto, Amber Guthridge on 05/19/2015 11:17:07 [name], Tylor S. ([id number]) -------------------------------------------------------------------------------- Physical Exam Details Patient Name: [name], [name] 05/19/2015 11:00 Date of Service: AM Medical Record [id number] Number: Patient Account Number: [id number] 1941/06/05 (74 y.o. Treating RN: Huel CoventryWoody, Kim Date of Birth/Sex: Female) Other Clinician: Primary Care Physician: Yetta FlockFeldpausch, Dale Treating Cooper Stamp Referring Physician: Maudie FlakesFeldpausch, Dale Physician/Extender: Tania AdeWeeks in Treatment: 4 Constitutional . Pulse regular. Respirations normal and unlabored. Afebrile. . Eyes Nonicteric. Reactive to light. Ears, Nose, Mouth, and Throat Lips, teeth, and gums WNL.Marland Kitchen. Moist mucosa without lesions . Neck supple and nontender. No palpable supraclavicular or cervical adenopathy. Normal sized without goiter. Respiratory WNL. No retractions.. Cardiovascular Pedal Pulses WNL. No clubbing, cyanosis or edema. Lymphatic No adneopathy. No adenopathy. No adenopathy. Musculoskeletal Adexa without tenderness or enlargement.. Digits and nails w/o clubbing, cyanosis, infection, petechiae, ischemia, or inflammatory conditions.. Integumentary (Hair, Skin) No suspicious lesions. No crepitus or fluctuance. No peri-wound  warmth or erythema. No masses.Marland Kitchen. Psychiatric Judgement and insight Intact.. No evidence of depression, anxiety, or agitation.. Notes The wound on the left big toe medially is completely covered with a small eschardue to the patient not being compliant it's difficult to peel this off. I would consider this wound healed. Electronic Signature(s) Signed: 05/19/2015 11:17:53 AM By: Evlyn KannerBritto, Seymour Pavlak MD, FACS Entered By: Evlyn KannerBritto, Lynnsey Barbara on 05/19/2015  11:17:52 [name], [name] ([id number]) -------------------------------------------------------------------------------- Physician Orders Details Patient Name: [name], [name] 05/19/2015 11:00 Date of Service: AM Medical Record [id number] Number: Patient Account Number: [id number] 26-Oct-1940 (74 y.o. Treating RN: Huel Coventry Date of Birth/Sex: Female) Other Clinician: Primary Care Physician: Yetta Flock Referring Physician: Maudie Flakes Physician/Extender: Tania Ade in Treatment: 4 Verbal / Phone Orders: Yes Clinician: Huel Coventry Read Back and Verified: Yes Diagnosis Coding Primary Wound Dressing Wound #2 Left Toe Great o Other: - paint with betadine Secondary Dressing Wound #2 Left Toe Great o Dry Gauze - and tape Discharge From [facility] Wound #2 Left Toe Great o Discharge from Wound Care Center - treatment complete Electronic Signature(s) Signed: 05/19/2015 3:36:42 PM By: Evlyn Kanner MD, FACS Signed: 05/19/2015 4:59:44 PM By: Elliot Gurney RN, BSN, Kim RN, BSN Entered By: Elliot Gurney, RN, BSN, Kim on 05/19/2015 11:16:47 [name], Stacie SMarland Kitchen ([id number]) -------------------------------------------------------------------------------- Problem List Details Patient Name: Stacie Mile S. 05/19/2015 11:00 Date of Service: AM Medical Record [id number] Number: Patient Account Number: [id number] 1940-10-15 (73 y.o. Treating RN: Huel Coventry Date of Birth/Sex: Female) Other Clinician: Primary Care Physician:  Maudie Flakes Treating Evlyn Kanner Referring Physician: Maudie Flakes Physician/Extender: Tania Ade in Treatment: 4 Active Problems ICD-10 Encounter Code Description Active Date Diagnosis L89.893 Pressure ulcer of other site, stage 3 04/18/2015 Yes S91.105A Unspecified open wound of left lesser toe(s) without 04/18/2015 Yes damage to nail, initial encounter Q05.9 Spina bifida, unspecified 04/18/2015 Yes F99 Mental disorder, not otherwise specified 04/18/2015 Yes Inactive Problems Resolved Problems Electronic Signature(s) Signed: 05/19/2015 11:16:54 AM By: Evlyn Kanner MD, FACS Entered By: Evlyn Kanner on 05/19/2015 11:16:54 [name], [name] ([id number]) -------------------------------------------------------------------------------- Progress Note Details Patient Name: Stacie Mile S. 05/19/2015 11:00 Date of Service: AM Medical Record [id number] Number: Patient Account Number: [id number] [birth year] (73 y.o. Treating RN: Huel Coventry Date of Birth/Sex: Female) Other Clinician: Primary Care Physician: Maudie Flakes Treating Evlyn Kanner Referring Physician: Maudie Flakes Physician/Extender: Tania Ade in Treatment: 4 Subjective Chief Complaint Information obtained from Caregiver Wound on the left second toe due to deformed foot causing pressure for about 2 weeks. History of Present Illness (HPI) The following HPI elements were documented for the patient's wound: Location: left second toe dorsum Quality: Patient reports No Pain. Severity: Patient states wound (s) are getting better. Duration: Patient has had the wound for < 2 weeks prior to presenting for treatment Timing: Pain in wound is Intermittent (comes and goes Context: The wound appeared gradually over time Modifying Factors: Consults to this date include: has been on Keflex for about 10 days Associated Signs and Symptoms: is wheelchair-bound 74 year old patient who has a history of spina bifida, cognitive  disability was seen by Dr. Filbert Schilder for a left second toe ulceration about 2 weeks ago. The patient has a past medical history of spina bifida, seizures, schizophrenia, depression, mental retardation. She has never been a smoker and has had no surgery. x-rays of the left foot were done which showed soft tissue swelling but no erosion involving the phalanx. With a diagnosis of cellulitis of the toe of the left foot the patient was placed on Keflex which was started on 04/09/2015 05/03/2015 -- while trying to clip her toenails which were rather long she had a superficial injury to the left big toe which is now an ulcer. She is yet to see a podiatrist. Objective Constitutional Pulse regular. Respirations normal and unlabored. Afebrile. Vitals Time Taken: 11:04 AM, Weight: 125 lbs, Pulse: 65 bpm, Respiratory Rate: 18 breaths/min, Blood [name], [name] (  [id number]) Pressure: 132/76 mmHg. Eyes Nonicteric. Reactive to light. Ears, Nose, Mouth, and Throat Lips, teeth, and gums WNL.Marland Kitchen Moist mucosa without lesions . Neck supple and nontender. No palpable supraclavicular or cervical adenopathy. Normal sized without goiter. Respiratory WNL. No retractions.. Cardiovascular Pedal Pulses WNL. No clubbing, cyanosis or edema. Lymphatic No adneopathy. No adenopathy. No adenopathy. Musculoskeletal Adexa without tenderness or enlargement.. Digits and nails w/o clubbing, cyanosis, infection, petechiae, ischemia, or inflammatory conditions.Marland Kitchen Psychiatric Judgement and insight Intact.. No evidence of depression, anxiety, or agitation.. General Notes: The wound on the left big toe medially is completely covered with a small eschardue to the patient not being compliant it's difficult to peel this off. I would consider this wound healed. Integumentary (Hair, Skin) No suspicious lesions. No crepitus or fluctuance. No peri-wound warmth or erythema. No masses.. Wound #2 status is Open. Original cause of  wound was Trauma. The wound is located on the Left Toe Great. The wound measures 0cm length x 0cm width x 0cm depth; 0cm^2 area and 0cm^3 volume. The wound is limited to skin breakdown. There is a large amount of sanguinous drainage noted. The wound margin is flat and intact. There is no granulation within the wound bed. There is a large (67-100%) amount of necrotic tissue within the wound bed including Eschar. The periwound skin appearance did not exhibit: Callus, Crepitus, Excoriation, Fluctuance, Friable, Induration, Localized Edema, Rash, Scarring, Dry/Scaly, Maceration, Moist, Atrophie Blanche, Cyanosis, Ecchymosis, Hemosiderin Staining, Mottled, Pallor, Rubor, Erythema. The periwound has tenderness on palpation. Assessment [name], [name] ([id number]) Active Problems ICD-10 L89.893 - Pressure ulcer of other site, stage 3 S91.105A - Unspecified open wound of left lesser toe(s) without damage to nail, initial encounter Q05.9 - Spina bifida, unspecified F99 - Mental disorder, not otherwise specified I have recommended the pain the eschar with some Betadine and keep a watch on this. I have considered the wound healed and I have discharged her from the wound care services. She will see the podiatrist regularly for her foot care. Plan Primary Wound Dressing: Wound #2 Left Toe Great: Other: - paint with betadine Secondary Dressing: Wound #2 Left Toe Great: Dry Gauze - and tape Discharge From [facility]: Wound #2 Left Toe Great: Discharge from Wound Care Center - treatment complete I have recommended the pain the eschar with some Betadine and keep a watch on this. I have considered the wound healed and I have discharged her from the wound care services. She will see the podiatrist regularly for her foot care. Electronic Signature(s) Signed: 05/19/2015 11:18:26 AM By: Evlyn Kanner MD, FACS Entered By: Evlyn Kanner on 05/19/2015 11:18:26 [name], [name] SMarland Kitchen  ([id number]) -------------------------------------------------------------------------------- SuperBill Details Patient Name: Stacie Coach. Date of Service: 05/19/2015 Medical Record Number: [id number] Patient Account Number: [id number] Date of Birth/Sex: 1941/01/28 (74 y.o. Female) Treating RN: Huel Coventry Primary Care Physician: Maudie Flakes Other Clinician: Referring Physician: Maudie Flakes Treating Physician/Extender: Rudene Re in Treatment: 4 Diagnosis Coding ICD-10 Codes Code Description [phone] Pressure ulcer of other site, stage 3 S91.105A Unspecified open wound of left lesser toe(s) without damage to nail, initial encounter Q05.9 Spina bifida, unspecified F99 Mental disorder, not otherwise specified Facility Procedures CPT4 Code: [id number] Description: [phone] - WOUND CARE VISIT-LEV 2 EST PT Modifier: Quantity: 1 Physician Procedures CPT4: Description Modifier Quantity Code [id number] - WC PHYS LEVEL 3 - EST PT 1 ICD-10 Description Diagnosis L89.893 Pressure ulcer of other site, stage 3 S91.105A Unspecified open wound of left lesser toe(s) without damage to nail, initial  encounter  Q05.9 Spina bifida, unspecified F99 Mental disorder, not otherwise specified Electronic Signature(s) Signed: 05/19/2015 11:25:44 AM By: Evlyn Kanner MD, FACS Entered By: Evlyn Kanner on 05/19/2015 11:25:44

## 2015-05-26 ENCOUNTER — Ambulatory Visit: Payer: Medicare Other | Admitting: Surgery

## 2015-05-30 ENCOUNTER — Encounter (INDEPENDENT_AMBULATORY_CARE_PROVIDER_SITE_OTHER): Payer: Self-pay

## 2015-05-30 ENCOUNTER — Encounter: Payer: Self-pay | Admitting: Adult Health

## 2015-05-30 ENCOUNTER — Ambulatory Visit (INDEPENDENT_AMBULATORY_CARE_PROVIDER_SITE_OTHER): Payer: Medicare Other | Admitting: Adult Health

## 2015-05-30 ENCOUNTER — Ambulatory Visit: Payer: Medicare Other | Admitting: Adult Health

## 2015-05-30 VITALS — BP 136/88 | HR 68 | Ht 59.0 in

## 2015-05-30 DIAGNOSIS — G40909 Epilepsy, unspecified, not intractable, without status epilepticus: Secondary | ICD-10-CM

## 2015-05-30 DIAGNOSIS — F73 Profound intellectual disabilities: Secondary | ICD-10-CM | POA: Diagnosis not present

## 2015-05-30 MED ORDER — KEPPRA 250 MG PO TABS: 250.0000 mg | ORAL_TABLET | Freq: Two times a day (BID) | ORAL | Status: DC

## 2015-05-30 NOTE — Progress Notes (Signed)
PATIENT: [name] DOB: [birth year]  REASON FOR VISIT: follow up- seizures HISTORY FROM: patient  HISTORY OF PRESENT ILLNESS: [name] is a 74 year old female with a history of seizures. She returns today for follow-up. She is currently taking Keppra and tolerating it well. Her caregivers are with her today and they deny any seizure activities. She continues to live in a group home. She requires some assistance with ADLs. She ambulates via a wheelchair. Caregiver's report that she did have an ulcer on her toe however that has now healed. They deny any new medical issues. She returns today for an evaluation.  HISTORY 05/28/14 ([name]): [name], 74 year old female returns for follow up. She is accompanied by 2 Caretakers, who report no seizure activity for years. She has a live long- history of mental retardation and developmental delay. She has spina bifida and is wheelchair-bound, she currently resides in a group home. She is seated in her wheelchair accompanied by 2 aides from the group time. EEG done in 2006 was abnormal with medication-induced artifact of Tranxene, n o seizure activity was seen however she was placed on Keppra and has had no seizure activity in 7 years. Appetite is good, sleeping well except occasionally wakes up about 3 am and can not get back to sleep. 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  She does not cooperate with the exam due to her severe mental retardation.  She is grunting, mumbling , she is agitated. She answered initially closed questions, answered "no " to the question if she feels cold cold and Yes to the question if the door should be closed.   She is incontinent of bowel and bladder and wears depends. She is on a thickened diet due to difficulty swallowing.  Continues to see [name] at [facility] for behavior issues.  No new medical issues. No falls , no injury - Needs refills  on her Keppra.   REVIEW OF SYSTEMS: Out of a complete 14 system review of symptoms, the patient complains only of the following symptoms, and all other reviewed systems are negative.  Constipation, incontinence of bladder, joint pain, behavior problem, agitation  ALLERGIES: Allergies  Allergen Reactions  . Sulfa Antibiotics   . Tegretol [Carbamazepine]     HOME MEDICATIONS: Outpatient Prescriptions Prior to Visit  Medication Sig Dispense Refill  . acetaminophen (TYLENOL) 500 MG tablet Take 500 mg by mouth at bedtime. Take 325 2 tabs every 4 hours for temp or pain. Make sure to space 4-6 hours from bedtime dose.    Marland Kitchen aspirin 81 MG tablet Take 81 mg by mouth daily.    . bacitracin ointment Apply 1 application topically 2 (two) times daily.    . bisacodyl (DULCOLAX) 10 MG suppository Place 10 mg rectally 2 (two) times a week.    . Calcium Carb-Cholecalciferol (CALCIUM 600 + D PO) Take by mouth 2 (two) times daily.    . carbamide peroxide (DEBROX) 6.5 % otic solution Place 5 drops into both ears every 21 ( twenty-one) days.    . cephALEXin (KEFLEX) 500 MG capsule Take 1 capsule (500 mg total) by mouth 3 (three) times daily. 30 capsule 0  . cetirizine (ZYRTEC) 10 MG tablet Take 10 mg by mouth as needed for allergies.    . Diphenhyd-Hydrocort-Nystatin (FIRST-DUKES MOUTHWASH MT) Use as directed in the mouth or throat.    . docusate (COLACE) 60 MG/15ML syrup Take 100 mg by mouth daily.     Marland Kitchen  folic acid (FOLVITE) 1 MG tablet Take 1 mg by mouth daily.    . Hydroactive Dressings (DUODERM HYDROACTIVE) MISC Apply topically as needed.    Marland Kitchen. KEPPRA 250 MG tablet Take 1 tablet (250 mg total) by mouth 2 (two) times daily. 60 tablet 11  . lamoTRIgine (LAMICTAL) 100 MG tablet 100 mg. 100 mg in the morning  50 mg at bedtime    . levothyroxine (SYNTHROID, LEVOTHROID) 25 MCG tablet 0.25 mcg daily. Take .5 daily    . LORazepam (ATIVAN) 0.5 MG tablet Take 0.5 mg by mouth as needed for anxiety.    . meloxicam  (MOBIC) 7.5 MG tablet Take 7.5 mg by mouth 2 (two) times daily as needed for pain (hold tylenol on days taking Mobic).    . mirtazapine (REMERON) 15 MG tablet at bedtime.    Marland Kitchen. OLANZAPINE PO Take by mouth as needed.    . ranitidine (ZANTAC) 150 MG tablet 150 mg daily.    . Skin Protectants, Misc. (BALMEX SKIN PROTECTANT) OINT Apply topically as needed.    Marland Kitchen. trypsin-balsam-castor oil (GRANULEX) topical spray Apply 1 application topically as needed for wound care.     No facility-administered medications prior to visit.    PAST MEDICAL HISTORY: Past Medical History  Diagnosis Date  . Spinal bifida, closed   . Seizure (HCC)   . Schizophrenia (HCC)   . Hypothyroidism   . Depression   . MR (mental retardation)     PAST SURGICAL HISTORY: No past surgical history on file.  FAMILY HISTORY: No family history on file.  SOCIAL HISTORY: Social History   Social History  . Marital Status: Single    Spouse Name: N/A  . Number of Children: N/A  . Years of Education: N/A   Occupational History  . Not on file.   Social History Main Topics  . Smoking status: Never Smoker   . Smokeless tobacco: Never Used  . Alcohol Use: No  . Drug Use: No  . Sexual Activity: Not on file   Other Topics Concern  . Not on file   Social History Narrative   Patient lives at a group home and is here today with her caregivers, [name], [name]   Patient does not drink any caffeine.   Patient is right-handed.   Patient is single.            PHYSICAL EXAM  Filed Vitals:   05/30/15 1105  BP: 136/88  Pulse: 68  Height: 4\' 11"  (1.499 m)   There is no weight on file to calculate BMI.  Generalized: Well developed, in no acute distress   Neurological examination  Mentation: Alert. Patient refused to participate in the exam. She Stating "I am not doing nothing" Gait and station: patient in a wheelchair  DIAGNOSTIC DATA (LABS, IMAGING, TESTING) - I reviewed patient records, labs, notes, testing  and imaging myself where available.     ASSESSMENT AND PLAN 74 y.o. year old female  has a past medical history of Spinal bifida, closed; Seizure (HCC); Schizophrenia (HCC); Hypothyroidism; Depression; and MR (mental retardation). here with:  1. Seizures 2. Mental retardation   Overall the patient is doing well. She has not had any additional seizures. She will continue on Keppra I will refill this today. Unfortunately she would not participate for  physical exam. Caregivers advised that if she has any seizure events patient let us know. She will follow-up in one year or sooner if needed.   Butch PennyMegan Dayjah Selman, MSN, NP-C 05/30/2015, 11:16  AM [facility] [address] [place], [place] [address] [phone]

## 2015-05-30 NOTE — Patient Instructions (Signed)
Continue Keppra.  If your symptoms worsen or you develop new symptoms please let us know.   

## 2015-05-30 NOTE — Progress Notes (Signed)
I agree with the assessment and plan as directed by NP .The patient is known to me .   Jamyra Zweig, MD  

## 2015-07-14 ENCOUNTER — Ambulatory Visit
Admission: EM | Admit: 2015-07-14 | Discharge: 2015-07-14 | Disposition: A | Discharge status: Home / Self Care | Payer: Medicare Other

## 2015-07-14 ENCOUNTER — Encounter: Payer: Self-pay | Admitting: Emergency Medicine

## 2015-07-14 DIAGNOSIS — R8290 Unspecified abnormal findings in urine: Secondary | ICD-10-CM | POA: Diagnosis not present

## 2015-07-14 NOTE — Discharge Instructions (Signed)
Her exam was normal.  She is stable at this time and is safe to return to her facility.  If she worsens: fever, SOB, change in mental status please take her to the ER.  Take care  Dr. Adriana Simasook

## 2015-07-14 NOTE — ED Provider Notes (Signed)
CSN: [id number]     Arrival date & time 07/14/15  1650 History   None    Chief Complaint  Patient presents with  . Dysuria   (Consider location/radiation/quality/duration/timing/severity/associated sxs/prior Treatment) HPI  74 year old female with accommodative past medical history including seizure disorder, mental retardation, hypothyroidism presents from her group home for evaluation.  She is accompanied by 2 caregivers today. They report that they were instructed to bring her here as it was thought that she may have a UTI. The patient is nonverbal and cannot provide any history. The caregivers cannot provide any history either. They do report her urine has an odor. No reports of fevers. I call her facility and no one answered the phone. It is unclear exactly why she is here.  Past Medical History  Diagnosis Date  . Spinal bifida, closed   . Seizure (HCC)   . Schizophrenia (HCC)   . Hypothyroidism   . Depression   . MR (mental retardation)    History reviewed. No pertinent past surgical history.   History reviewed. No pertinent family history.   Social History  Substance Use Topics  . Smoking status: Never Smoker   . Smokeless tobacco: Never Used  . Alcohol Use: No   OB History    No data available     Review of Systems Unable to obtain as patient has MR and is nonverbal.  Allergies  Sulfa antibiotics and Tegretol  Home Medications   Prior to Admission medications   Medication Sig Start Date End Date Taking? Authorizing Provider  acetaminophen (TYLENOL) 500 MG tablet Take 500 mg by mouth at bedtime. Take 325 2 tabs every 4 hours for temp or pain. Make sure to space 4-6 hours from bedtime dose.    Historical Provider, MD  aspirin 81 MG tablet Take 81 mg by mouth daily.    Historical Provider, MD  bacitracin ointment Apply 1 application topically 2 (two) times daily.    Historical Provider, MD  bisacodyl (DULCOLAX) 10 MG suppository Place 10 mg rectally 2 (two)  times a week.    Historical Provider, MD  Calcium Carb-Cholecalciferol (CALCIUM 600 + D PO) Take by mouth 2 (two) times daily.    Historical Provider, MD  carbamide peroxide (DEBROX) 6.5 % otic solution Place 5 drops into both ears every 21 ( twenty-one) days.    Historical Provider, MD  cephALEXin (KEFLEX) 500 MG capsule Take 1 capsule (500 mg total) by mouth 3 (three) times daily. 04/09/15   Payton Mccallum, MD  cetirizine (ZYRTEC) 10 MG tablet Take 10 mg by mouth daily as needed for allergies.     Historical Provider, MD  Diphenhyd-Hydrocort-Nystatin (FIRST-DUKES MOUTHWASH MT) Use as directed in the mouth or throat.    Historical Provider, MD  docusate (COLACE) 60 MG/15ML syrup Take 100 mg by mouth daily.     Historical Provider, MD  folic acid (FOLVITE) 1 MG tablet Take 1 mg by mouth daily.    Historical Provider, MD  Hydroactive Dressings (DUODERM HYDROACTIVE) MISC Apply topically as needed.    Historical Provider, MD  KEPPRA 250 MG tablet Take 1 tablet (250 mg total) by mouth 2 (two) times daily. 05/30/15   Butch Penny, NP  lamoTRIgine (LAMICTAL) 100 MG tablet 100 mg. 100 mg in the morning  50 mg at bedtime 05/21/13   Historical Provider, MD  levothyroxine (SYNTHROID, LEVOTHROID) 25 MCG tablet 0.25 mcg daily. Take .5 daily 05/21/13   Historical Provider, MD  LORazepam (ATIVAN) 0.5 MG tablet Take 0.5  mg by mouth as needed for anxiety.    Historical Provider, MD  meloxicam (MOBIC) 7.5 MG tablet Take 7.5 mg by mouth 2 (two) times daily as needed for pain (hold tylenol on days taking Mobic).    Historical Provider, MD  mirtazapine (REMERON) 15 MG tablet at bedtime. 05/21/13   Historical Provider, MD  OLANZapine (ZYPREXA) 5 MG tablet Take 5 mg by mouth at bedtime. 1 tab PO as crisis med for agitation greater than 10 minutes, with approval of RN, per the Behavior support plan    Historical Provider, MD  OLANZAPINE PO Take 2.5 mg by mouth daily. @ 7pm    Historical Provider, MD  ranitidine (ZANTAC) 150  MG tablet 150 mg daily. 05/21/13   Historical Provider, MD  Skin Protectants, Misc. (BALMEX SKIN PROTECTANT) OINT Apply topically as needed.    Historical Provider, MD  trypsin-balsam-castor oil [facility](GRANULEX) topical spray Apply 1 application topically as needed for wound care.    Historical Provider, MD   Meds Ordered and Administered this Visit  Medications - No data to display  BP 154/79 mmHg  Pulse 55  Temp(Src) 96 F (35.6 C) (Tympanic)  Resp 16  SpO2 100% No data found.  Physical Exam  Constitutional:  Chronically ill-appearing female resting in bed. No acute distress.  HENT:  Head: Normocephalic and atraumatic.  MMM.  Eyes: Conjunctivae are normal.  Cardiovascular: Regular rhythm.  Bradycardia present.   Pulmonary/Chest: Effort normal and breath sounds normal. No respiratory distress. She has no wheezes. She has no rales.  Abdominal: Soft. She exhibits no distension. There is no tenderness.  Neurological:  Awake; groans. Does not respond to questioning.  Vitals reviewed.  ED Course  Procedures (including critical care time)  Labs Review Labs Reviewed  URINE CULTURE  URINALYSIS COMPLETEWITH MICROSCOPIC ([facility] ONLY)   Imaging Review No results found.  MDM   1. Foul smelling urine    74 year old female with accommodative past medical history including mental retardation and seizure disorder was brought in by members of her facility for evaluation with reported concerns for UTI. No further history could be provided other than her urine has an odor. I called her facility and no one answered. I could not get any further history. Patient is afebrile with an unremarkable exam. Her mental status is consistent with prior notes per the EMR. I had a long discussion with the 2 caregivers/staff members from her facility today. I informed them that her exam was unremarkable and her vital signs were stable. The likelihood that her urine is colonized as high making catheterization and  subsequent urinalysis futile. Additionally, I informed them that proceeding with bladder catheterization would cause more harm than good at this point in time. She is stable for transport back to her facility. Her caregivers are in agreement. I am not sending her to the ER for further evaluation as her exam is stable and she is afebrile.   Tommie SamsJayce G Pascale Maves, DO 07/14/15 1821

## 2015-07-14 NOTE — ED Notes (Signed)
Staff that brought her here states that they think she has a UTI.

## 2015-11-28 ENCOUNTER — Ambulatory Visit
Admission: EM | Admit: 2015-11-28 | Discharge: 2015-11-28 | Disposition: A | Discharge status: Home / Self Care | Payer: Medicare Other | Attending: Family Medicine | Admitting: Family Medicine

## 2015-11-28 ENCOUNTER — Encounter: Payer: Self-pay | Admitting: Emergency Medicine

## 2015-11-28 DIAGNOSIS — R059 Cough, unspecified: Secondary | ICD-10-CM

## 2015-11-28 DIAGNOSIS — R05 Cough: Secondary | ICD-10-CM

## 2015-11-28 DIAGNOSIS — J40 Bronchitis, not specified as acute or chronic: Secondary | ICD-10-CM | POA: Diagnosis not present

## 2015-11-28 MED ORDER — DOXYCYCLINE HYCLATE 100 MG PO TABS: 100.0000 mg | ORAL_TABLET | Freq: Two times a day (BID) | ORAL | Status: DC

## 2015-11-28 NOTE — Discharge Instructions (Signed)

## 2015-11-28 NOTE — ED Provider Notes (Signed)
CSN: [id number]     Arrival date & time 11/28/15  1651 History   First MD Initiated Contact with Patient 11/28/15 1725     Chief Complaint  Patient presents with  . Nasal Congestion  . Cough   (Consider location/radiation/quality/duration/timing/severity/associated sxs/prior Treatment) Patient is a 75 y.o. female presenting with cough and URI. The history is provided by a caregiver.  Cough Cough characteristics:  Productive Duration:  7 days Timing:  Constant Progression:  Worsening Chronicity:  New Smoker: no   URI Presenting symptoms: congestion and cough   Severity:  Moderate Onset quality:  Sudden Duration:  7 days Timing:  Constant Progression:  Worsening Chronicity:  New Worsened by:  Nothing tried Risk factors: being elderly and sick contacts   Risk factors: no chronic cardiac disease, no chronic kidney disease, no chronic respiratory disease, no diabetes mellitus, no immunosuppression, no recent illness and no recent travel     Past Medical History  Diagnosis Date  . Spinal bifida, closed   . Seizure (HCC)   . Schizophrenia (HCC)   . Hypothyroidism   . Depression   . MR (mental retardation)    History reviewed. No pertinent past surgical history. No family history on file. Social History  Substance Use Topics  . Smoking status: Never Smoker   . Smokeless tobacco: Never Used  . Alcohol Use: No   OB History    No data available     Review of Systems  HENT: Positive for congestion.   Respiratory: Positive for cough.     Allergies  Sulfa antibiotics and Tegretol  Home Medications   Prior to Admission medications   Medication Sig Start Date End Date Taking? Authorizing Provider  acetaminophen (TYLENOL) 500 MG tablet Take 500 mg by mouth at bedtime. Take 325 2 tabs every 4 hours for temp or pain. Make sure to space 4-6 hours from bedtime dose.    Historical Provider, MD  aspirin 81 MG tablet Take 81 mg by mouth daily.    Historical Provider, MD   bacitracin ointment Apply 1 application topically 2 (two) times daily.    Historical Provider, MD  bisacodyl (DULCOLAX) 10 MG suppository Place 10 mg rectally 2 (two) times a week.    Historical Provider, MD  Calcium Carb-Cholecalciferol (CALCIUM 600 + D PO) Take by mouth 2 (two) times daily.    Historical Provider, MD  carbamide peroxide (DEBROX) 6.5 % otic solution Place 5 drops into both ears every 21 ( twenty-one) days.    Historical Provider, MD  cephALEXin (KEFLEX) 500 MG capsule Take 1 capsule (500 mg total) by mouth 3 (three) times daily. 04/09/15   Payton Mccallum, MD  cetirizine (ZYRTEC) 10 MG tablet Take 10 mg by mouth daily as needed for allergies.     Historical Provider, MD  Diphenhyd-Hydrocort-Nystatin (FIRST-DUKES MOUTHWASH MT) Use as directed in the mouth or throat.    Historical Provider, MD  docusate (COLACE) 60 MG/15ML syrup Take 100 mg by mouth daily.     Historical Provider, MD  doxycycline (VIBRA-TABS) 100 MG tablet Take 1 tablet (100 mg total) by mouth 2 (two) times daily. 11/28/15   Payton Mccallum, MD  folic acid (FOLVITE) 1 MG tablet Take 1 mg by mouth daily.    Historical Provider, MD  Hydroactive Dressings (DUODERM HYDROACTIVE) MISC Apply topically as needed.    Historical Provider, MD  KEPPRA 250 MG tablet Take 1 tablet (250 mg total) by mouth 2 (two) times daily. 05/30/15   Butch Penny, NP  lamoTRIgine (LAMICTAL) 100 MG tablet 100 mg. 100 mg in the morning  50 mg at bedtime 05/21/13   Historical Provider, MD  levothyroxine (SYNTHROID, LEVOTHROID) 25 MCG tablet 0.25 mcg daily. Take .5 daily 05/21/13   Historical Provider, MD  LORazepam (ATIVAN) 0.5 MG tablet Take 0.5 mg by mouth as needed for anxiety.    Historical Provider, MD  meloxicam (MOBIC) 7.5 MG tablet Take 7.5 mg by mouth 2 (two) times daily as needed for pain (hold tylenol on days taking Mobic).    Historical Provider, MD  mirtazapine (REMERON) 15 MG tablet at bedtime. 05/21/13   Historical Provider, MD   OLANZapine (ZYPREXA) 5 MG tablet Take 5 mg by mouth at bedtime. 1 tab PO as crisis med for agitation greater than 10 minutes, with approval of RN, per the Behavior support plan    Historical Provider, MD  OLANZAPINE PO Take 2.5 mg by mouth daily. @ 7pm    Historical Provider, MD  ranitidine (ZANTAC) 150 MG tablet 150 mg daily. 05/21/13   Historical Provider, MD  Skin Protectants, Misc. (BALMEX SKIN PROTECTANT) OINT Apply topically as needed.    Historical Provider, MD  trypsin-balsam-castor oil [facility]) topical spray Apply 1 application topically as needed for wound care.    Historical Provider, MD   Meds Ordered and Administered this Visit  Medications - No data to display  BP 159/133 mmHg  Pulse 85  Temp(Src) 96.1 F (35.6 C) (Oral)  Resp 20  SpO2 100% No data found.   Physical Exam  Constitutional: She appears well-developed and well-nourished. No distress.  HENT:  Head: Normocephalic and atraumatic.  Right Ear: Tympanic membrane, external ear and ear canal normal.  Left Ear: Tympanic membrane, external ear and ear canal normal.  Nose: Mucosal edema and rhinorrhea present. No nose lacerations, sinus tenderness, nasal deformity, septal deviation or nasal septal hematoma. No epistaxis.  No foreign bodies.  Mouth/Throat: Uvula is midline, oropharynx is clear and moist and mucous membranes are normal. No oropharyngeal exudate.  Eyes: Conjunctivae and EOM are normal. Pupils are equal, round, and reactive to light. Right eye exhibits no discharge. Left eye exhibits no discharge. No scleral icterus.  Neck: Normal range of motion. Neck supple. No thyromegaly present.  Cardiovascular: Normal rate, regular rhythm and normal heart sounds.   Pulmonary/Chest: Effort normal. No respiratory distress. She has no wheezes. She has rales (left base).  Lymphadenopathy:    She has no cervical adenopathy.  Neurological: She is alert.  Skin: She is not diaphoretic.  Nursing note and vitals  reviewed.   ED Course  Procedures (including critical care time)  Labs Review Labs Reviewed - No data to display  Imaging Review No results found.   Visual Acuity Review  Right Eye Distance:   Left Eye Distance:   Bilateral Distance:    Right Eye Near:   Left Eye Near:    Bilateral Near:         MDM   1. Cough   2. Bronchitis    Discharge Medication List as of 11/28/2015  5:50 PM    START taking these medications   Details  doxycycline (VIBRA-TABS) 100 MG tablet Take 1 tablet (100 mg total) by mouth 2 (two) times daily., Starting 11/28/2015, Until Discontinued, Normal       1.  diagnosis reviewed with patient 2. rx as per orders above; reviewed possible side effects, interactions, risks and benefits  3. Recommend supportive treatment with rest, increased fluids  4. Follow-up prn if  symptoms worsen or don't improve    Payton Mccallumrlando Rogena Deupree, MD 11/28/15 1910

## 2015-11-28 NOTE — ED Notes (Signed)
Per caregiver, pt with cough, runny nose and congestion for one week and got worse over the weekend.

## 2016-05-03 ENCOUNTER — Other Ambulatory Visit: Payer: Self-pay | Admitting: Adult Health

## 2016-05-03 DIAGNOSIS — F73 Profound intellectual disabilities: Secondary | ICD-10-CM

## 2016-05-03 DIAGNOSIS — G40909 Epilepsy, unspecified, not intractable, without status epilepticus: Secondary | ICD-10-CM

## 2016-05-29 ENCOUNTER — Ambulatory Visit (INDEPENDENT_AMBULATORY_CARE_PROVIDER_SITE_OTHER): Payer: Medicare Other | Admitting: Adult Health

## 2016-05-29 ENCOUNTER — Encounter: Payer: Self-pay | Admitting: Adult Health

## 2016-05-29 VITALS — BP 149/69 | HR 47 | Ht 59.0 in

## 2016-05-29 DIAGNOSIS — F73 Profound intellectual disabilities: Secondary | ICD-10-CM

## 2016-05-29 DIAGNOSIS — G40909 Epilepsy, unspecified, not intractable, without status epilepticus: Secondary | ICD-10-CM

## 2016-05-29 MED ORDER — KEPPRA 250 MG PO TABS: 250.0000 mg | ORAL_TABLET | Freq: Two times a day (BID) | ORAL | 11 refills | Status: DC

## 2016-05-29 NOTE — Progress Notes (Signed)
PATIENT: [name] DOB: 10/13/1940  REASON FOR VISIT: follow up- seizures HISTORY FROM: patient  HISTORY OF PRESENT ILLNESS: Mrs. [name] is a 75 year old female with a history of seizures and mental retardation. She returns today for follow-up. She remains on Keppra and is tolerating it well. She is accompanied today by her caregivers. They report that she's not had any seizures. She does require assistance with some ADLs. She uses a wheelchair when ambulating. She lives at a group home. She does not operate a motor vehicle. Denies any significant changes in mood or behavior. She returns today for an evaluation.  HISTORY 05/30/15 (MM): Ms [name] is a 75 year old female with a history of seizures. She returns today for follow-up. She is currently taking Keppra and tolerating it well. Her caregivers are with her today and they deny any seizure activities. She continues to live in a group home. She requires some assistance with ADLs. She ambulates via a wheelchair. Caregiver's report that she did have an ulcer on her toe however that has now healed. They deny any new medical issues. She returns today for an evaluation.  HISTORY 05/28/14 ([place]): Ms. [name], 75 year old female returns for follow up. She is accompanied by 2 Caretakers, who report no seizure activity for years. She has a live long- history of mental retardation and developmental delay. She has spina bifida and is wheelchair-bound, she currently resides in a group home. She is seated in her wheelchair accompanied by 2 aides from the group time. EEG done in 2006 was abnormal with medication-induced artifact of Tranxene, n o seizure activity was seen however she was placed on Keppra and has had no seizure activity in 7 years. Appetite is good, sleeping well except occasionally wakes up about 3 am and can not get back to sleep. 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  She  does not cooperate with the exam due to her severe mental retardation.  She is grunting, mumbling , she is agitated. She answered initially closed questions, answered "no " to the question if she feels cold cold and Yes to the question if the door should be closed.   She is incontinent of bowel and bladder and wears depends. She is on a thickened diet due to difficulty swallowing.  [facility].  No new medical issues. No falls , no injury - Needs refills on her Keppra.   REVIEW OF SYSTEMS: Out of a complete 14 system review of symptoms, the patient complains only of the following symptoms, and all other reviewed systems are negative.  See HPI  ALLERGIES: Allergies  Allergen Reactions  . Sulfa Antibiotics   . Tegretol [Carbamazepine]     HOME MEDICATIONS: Outpatient Medications Prior to Visit  Medication Sig Dispense Refill  . acetaminophen (TYLENOL) 500 MG tablet Take 500 mg by mouth at bedtime. Take 325 2 tabs every 4 hours for temp or pain. Make sure to space 4-6 hours from bedtime dose.    Marland Kitchen. aspirin 81 MG tablet Take 81 mg by mouth daily.    . bacitracin ointment Apply 1 application topically 2 (two) times daily.    . bisacodyl (DULCOLAX) 10 MG suppository Place 10 mg rectally 2 (two) times a week.    . Calcium Carb-Cholecalciferol (CALCIUM 600 + D PO) Take by mouth 2 (two) times daily.    . carbamide peroxide (DEBROX) 6.5 % otic solution Place 5 drops into  both ears every 21 ( twenty-one) days.    . cetirizine (ZYRTEC) 10 MG tablet Take 10 mg by mouth daily as needed for allergies.     . Diphenhyd-Hydrocort-Nystatin (FIRST-DUKES MOUTHWASH MT) Use as directed in the mouth or throat.    . docusate (COLACE) 60 MG/15ML syrup Take 100 mg by mouth daily.     . folic acid (FOLVITE) 1 MG tablet Take 1 mg by mouth daily.    . Hydroactive Dressings (DUODERM HYDROACTIVE) MISC Apply topically as needed.    Marland Kitchen. KEPPRA 250 MG tablet  TAKE ONE TABLET BY MOUTH TWICE DAILY 60 tablet 11  . lamoTRIgine (LAMICTAL) 100 MG tablet 100 mg. 100 mg in the morning  50 mg at bedtime    . levothyroxine (SYNTHROID, LEVOTHROID) 25 MCG tablet 0.25 mcg daily. Take .5 daily    . LORazepam (ATIVAN) 0.5 MG tablet Take 0.5 mg by mouth as needed for anxiety.    . mirtazapine (REMERON) 15 MG tablet at bedtime.    Marland Kitchen. OLANZapine (ZYPREXA) 5 MG tablet Take 5 mg by mouth at bedtime. 1 tab PO as crisis med for agitation greater than 10 minutes, with approval of RN, per the Behavior support plan    . OLANZAPINE PO Take 2.5 mg by mouth daily. @ 7pm    . ranitidine (ZANTAC) 150 MG tablet 150 mg daily.    . Skin Protectants, Misc. (BALMEX SKIN PROTECTANT) OINT Apply topically as needed.    Marland Kitchen. trypsin-balsam-castor oil (GRANULEX) topical spray Apply 1 application topically as needed for wound care.    . cephALEXin (KEFLEX) 500 MG capsule Take 1 capsule (500 mg total) by mouth 3 (three) times daily. 30 capsule 0  . doxycycline (VIBRA-TABS) 100 MG tablet Take 1 tablet (100 mg total) by mouth 2 (two) times daily. (Patient not taking: Reported on 05/29/2016) 20 tablet 0  . meloxicam (MOBIC) 7.5 MG tablet Take 7.5 mg by mouth 2 (two) times daily as needed for pain (hold tylenol on days taking Mobic).     No facility-administered medications prior to visit.     PAST MEDICAL HISTORY: Past Medical History:  Diagnosis Date  . Depression   . Hypothyroidism   . MR (mental retardation)   . Schizophrenia (HCC)   . Seizure (HCC)   . Spinal bifida, closed     PAST SURGICAL HISTORY: No past surgical history on file.  FAMILY HISTORY: No family history on file.  SOCIAL HISTORY: Social History   Social History  . Marital status: Single    Spouse name: N/A  . Number of children: N/A  . Years of education: N/A   Occupational History  . Not on file.   Social History Main Topics  . Smoking status: Never Smoker  . Smokeless tobacco: Never Used  . Alcohol  use No  . Drug use: No  . Sexual activity: Not on file   Other Topics Concern  . Not on file   Social History Narrative   Patient lives at a group home and is here today with her caregivers, Jeani SowDianna, Rhonda.   Patient does not drink any caffeine.   Patient is right-handed.   Patient is single.            PHYSICAL EXAM  Vitals:   05/29/16 1007  BP: (!) 149/69  Pulse: (!) 47  Height: 4\' 11"  (1.499 m)   There is no height or weight on file to calculate BMI.  Generalized: Well developed, in no acute distress  Neurological examination  Mentation: Alert. Follows all commands intermittently. uncooperative on exam Cranial nerve II-XII: unable to test- patient uncoorperative Motor: unable to test- patient uncoorperative Sensory:  unable to test- patient uncoorperative Coordination: unable to test- patient uncoorperative Gait and station:patient in a wheelchair   DIAGNOSTIC DATA (LABS, IMAGING, TESTING) - I reviewed patient records, labs, notes, testing and imaging myself where available.       ASSESSMENT AND PLAN 75 y.o. year old female  has a past medical history of Depression; Hypothyroidism; MR (mental retardation); Schizophrenia (HCC); Seizure (HCC); and Spinal bifida, closed. here with:  1. Seizures  Overall the patient is doing well. She will continue on Keppra. Advised patient and her caregivers if she has any seizure events patient let us know. She will follow-up in one year with Dr. Vickey Huger.     Butch Penny, MSN, NP-C 05/29/2016, 10:36 AM [facility] [address], [address] [place], [place] [address] [phone]

## 2016-05-29 NOTE — Patient Instructions (Signed)
Continue Keppra If you have any seizure events please let us know.   

## 2016-05-29 NOTE — Progress Notes (Signed)
I agree with the assessment and plan as directed by NP .The patient is known to me .   Aster Screws, MD  

## 2016-07-31 IMAGING — CT CT HEAD W/O CM
2 series · 14 of 30 positions shown, 16 images · non-contrast
Comparison: CT orbits 12/19/2012

CLINICAL DATA: Swelling and bruising of RIGHT eye

EXAM:
CT HEAD WITHOUT CONTRAST
TECHNIQUE: Contiguous axial images were obtained from the base of the skull
through the vertex without intravenous contrast.

[Series 2: head bone · axial · 0.39mm/px · z∈[+391,+527]mm · 8 of 85 slices shown]
[im 9/85  bone]
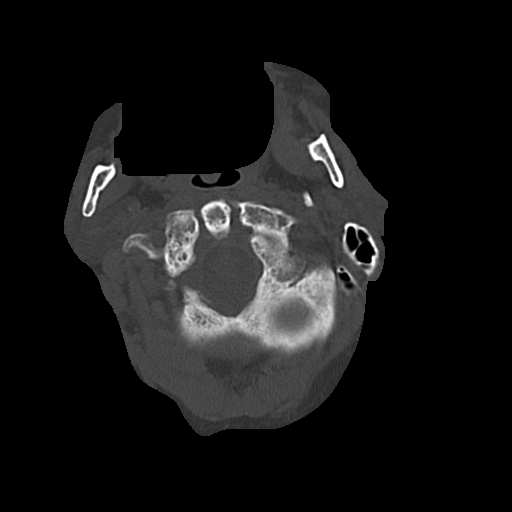
[im 17/85  bone]
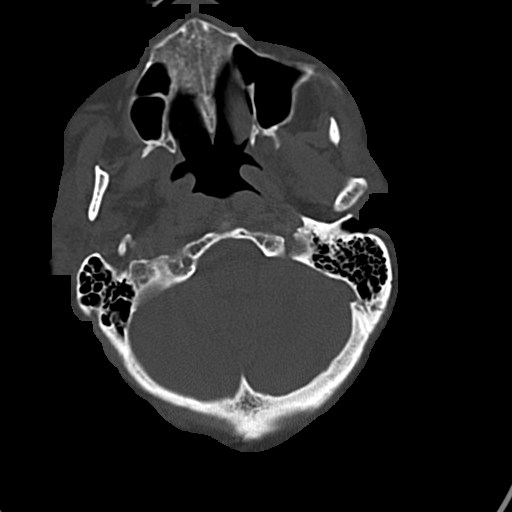
[im 29/85  bone]
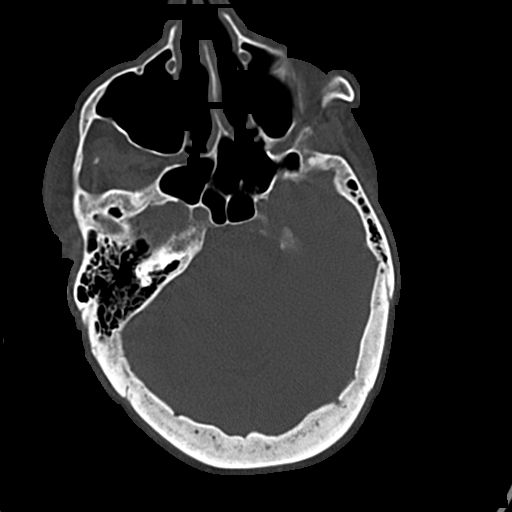
[im 37/85  bone]
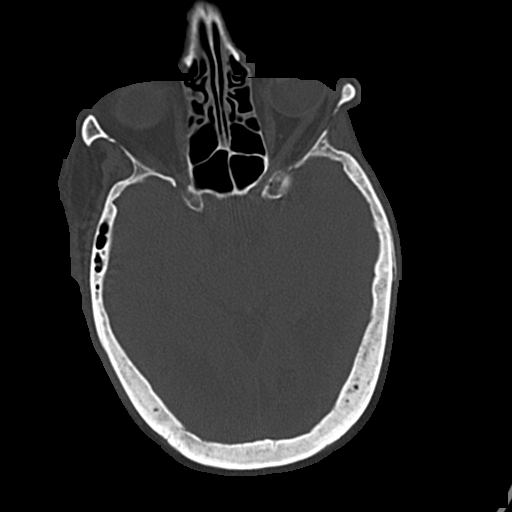
[im 49/85  bone]
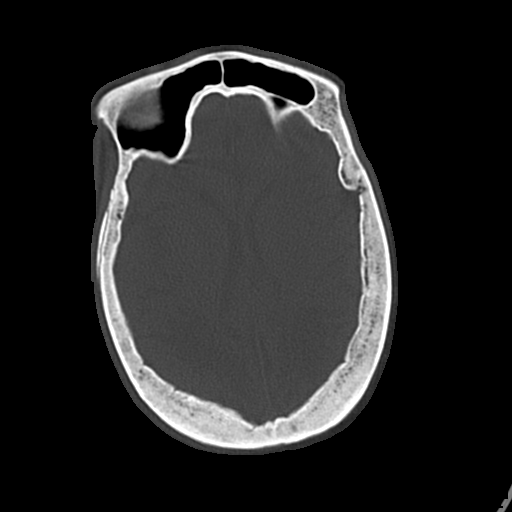
[im 57/85  bone]
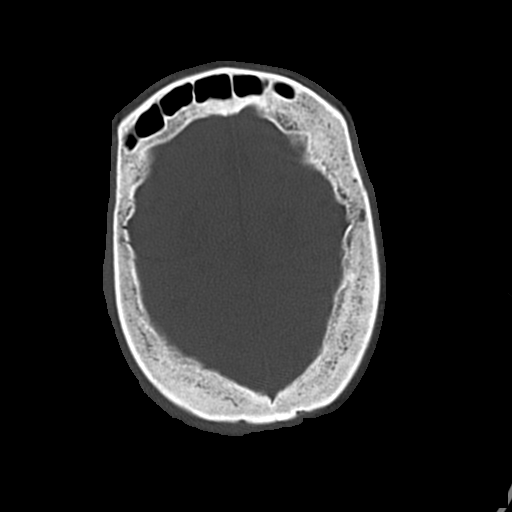
[im 69/85  bone]
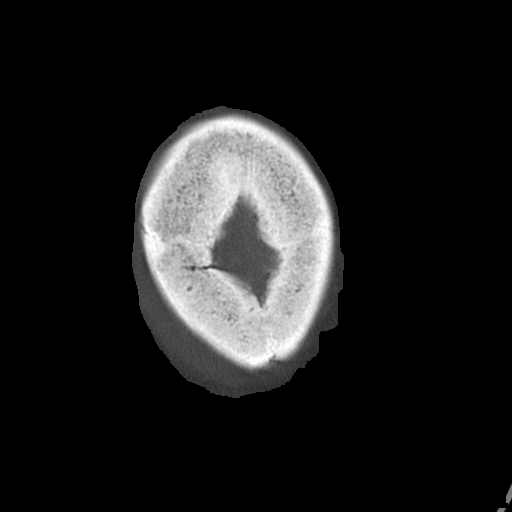
[im 77/85  bone]
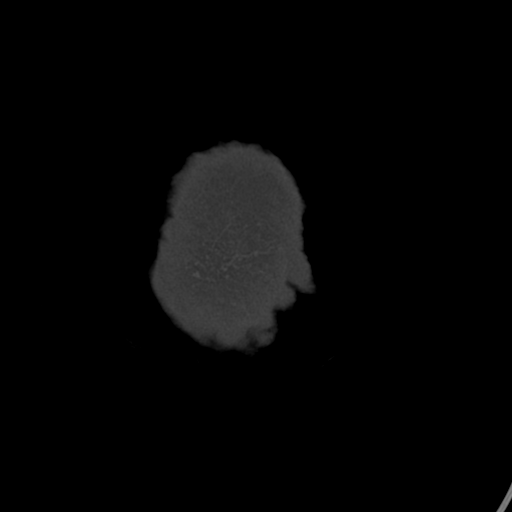

[Series 3: head wo · axial · 0.39mm/px · z∈[+404,+509]mm · 6 of 31 slices shown, 8 images]
[im 5/31  brain]
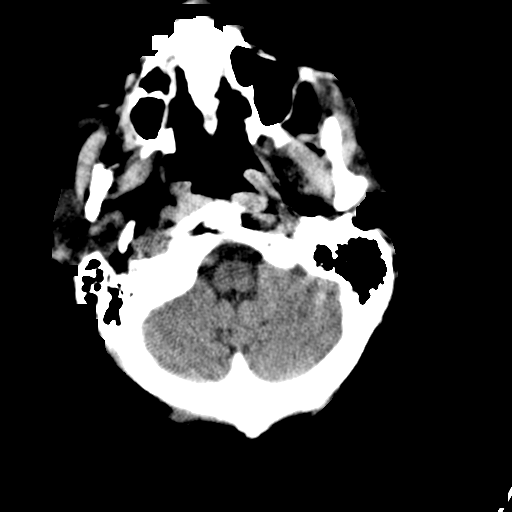
[im 5/31  bone]
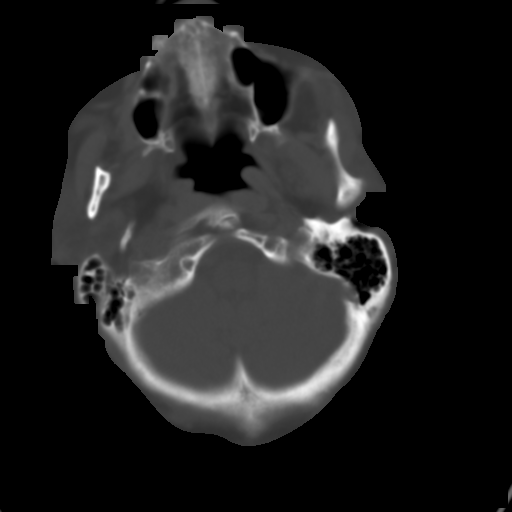
[im 9/31  brain]
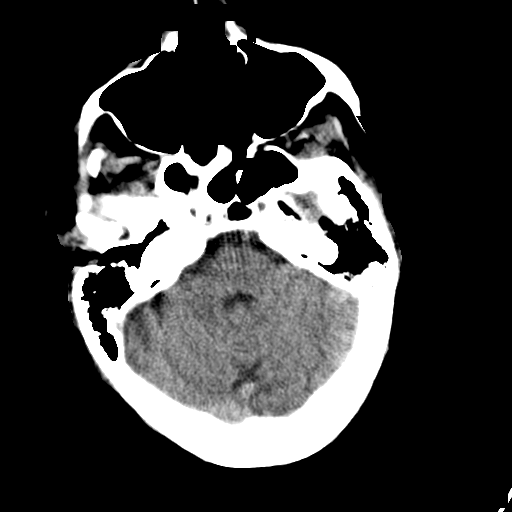
[im 13/31  brain]
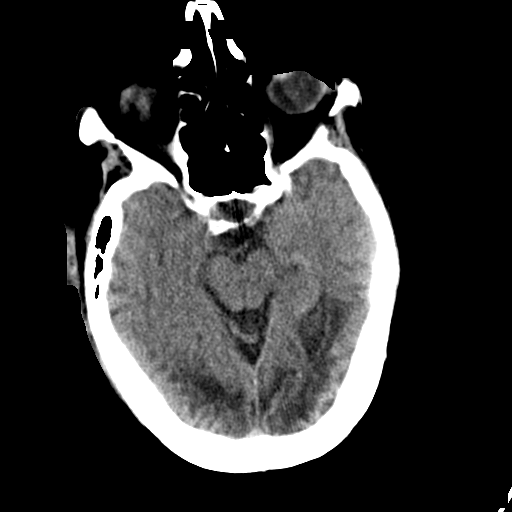
[im 18/31  brain]
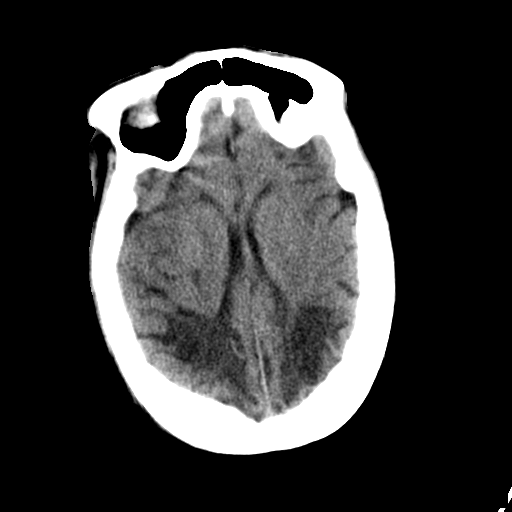
[im 22/31  brain]
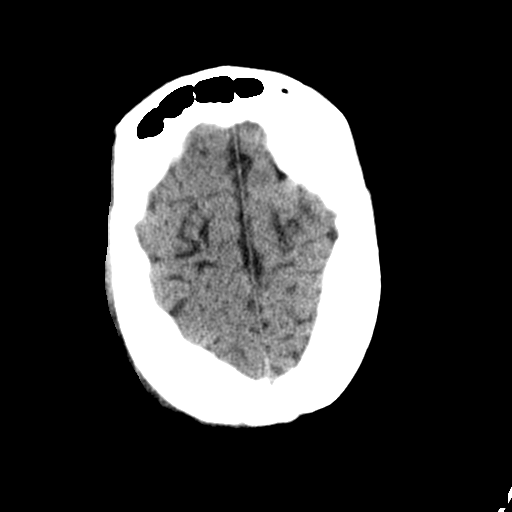
[im 22/31  bone]
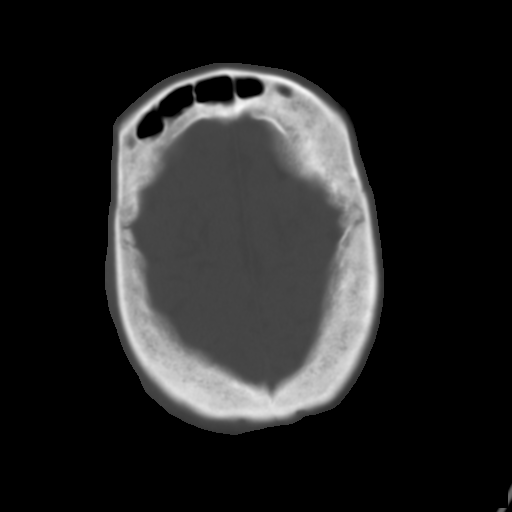
[im 26/31  brain]
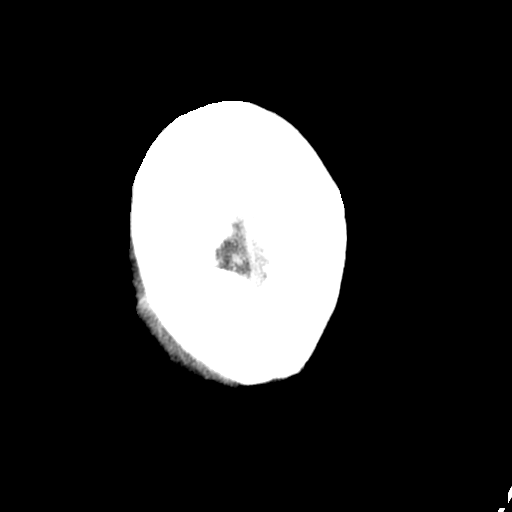

[14 of 30 positions shown; findings below may reference images not displayed]

FINDINGS: Motion artifacts limit exam.

Generalized atrophy.

Dilatation of the atria and occipital horns of the lateral
ventricles bilaterally due to BILATERAL old posterior cerebral
infarcts, unchanged.

Within limitations of motion, no definite acute intracranial
hemorrhage, mass lesion, or evidence of acute infarction.

No extra-axial fluid collections.

Bones and sinuses unremarkable.

RIGHT periorbital soft tissue swelling/contusion extending to RIGHT
temporal region.

Dysconjugate gaze.
IMPRESSION: Limitations of exam secondary to motion.

Atrophy with old BILATERAL posterior hemispheric infarcts.

No acute intracranial abnormalities.

## 2016-07-31 IMAGING — CR DG HAND COMPLETE 3+V*L*
1 series · 3 of 3 positions shown · non-contrast
Comparison: None.

CLINICAL DATA: 73-year-old female with pain, bruising and swelling
over the second and third fingers, unknown injury. Initial
encounter.

EXAM:
LEFT HAND - COMPLETE 3+ VIEW

[Series 1: pa · 0.17mm/px · 3 of 3 slices shown]
[im 1/3]
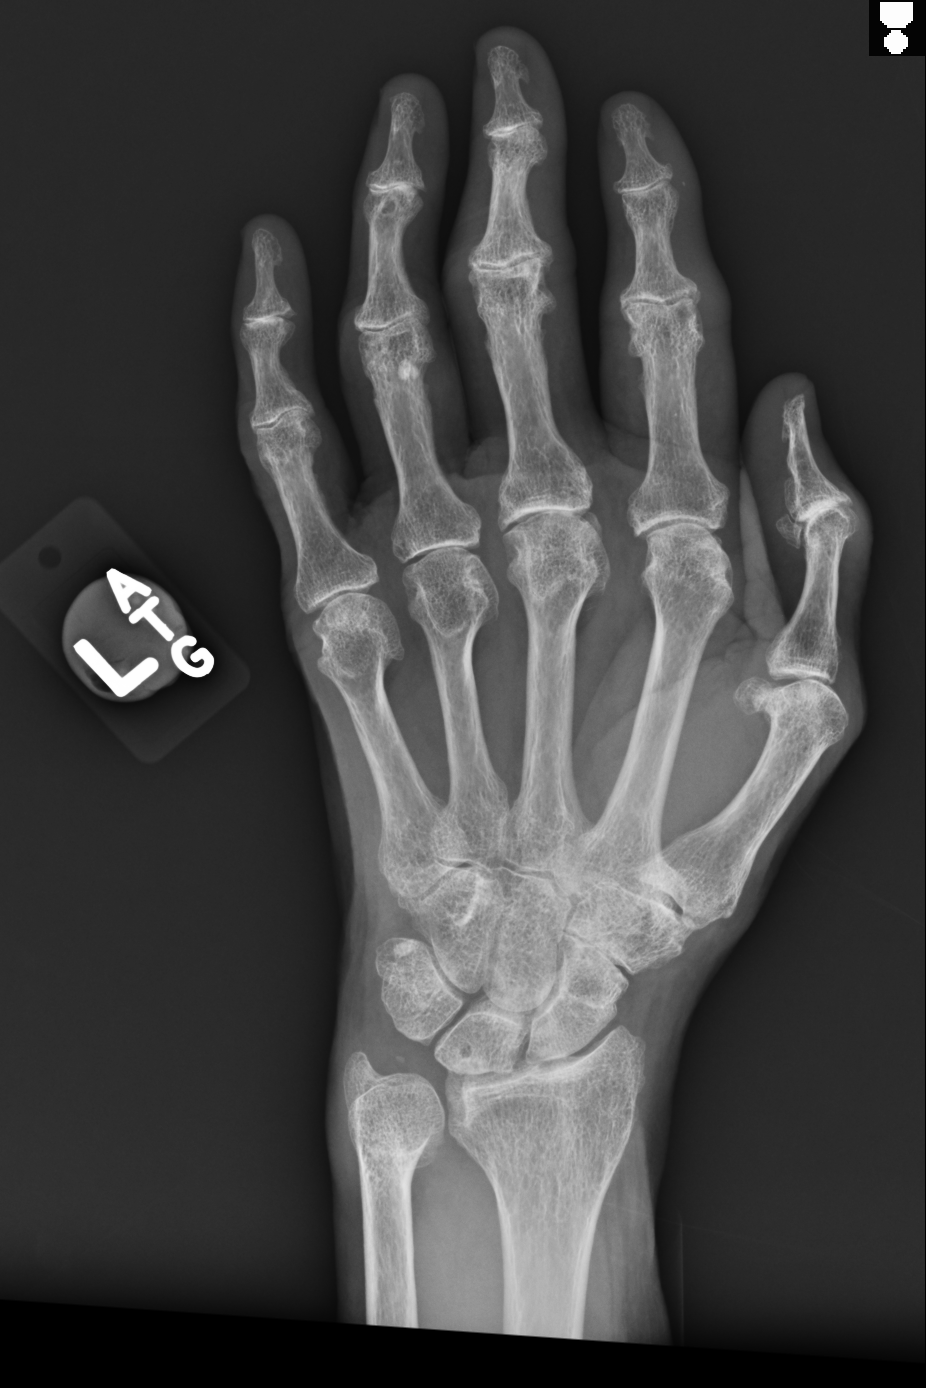
[im 2/3]
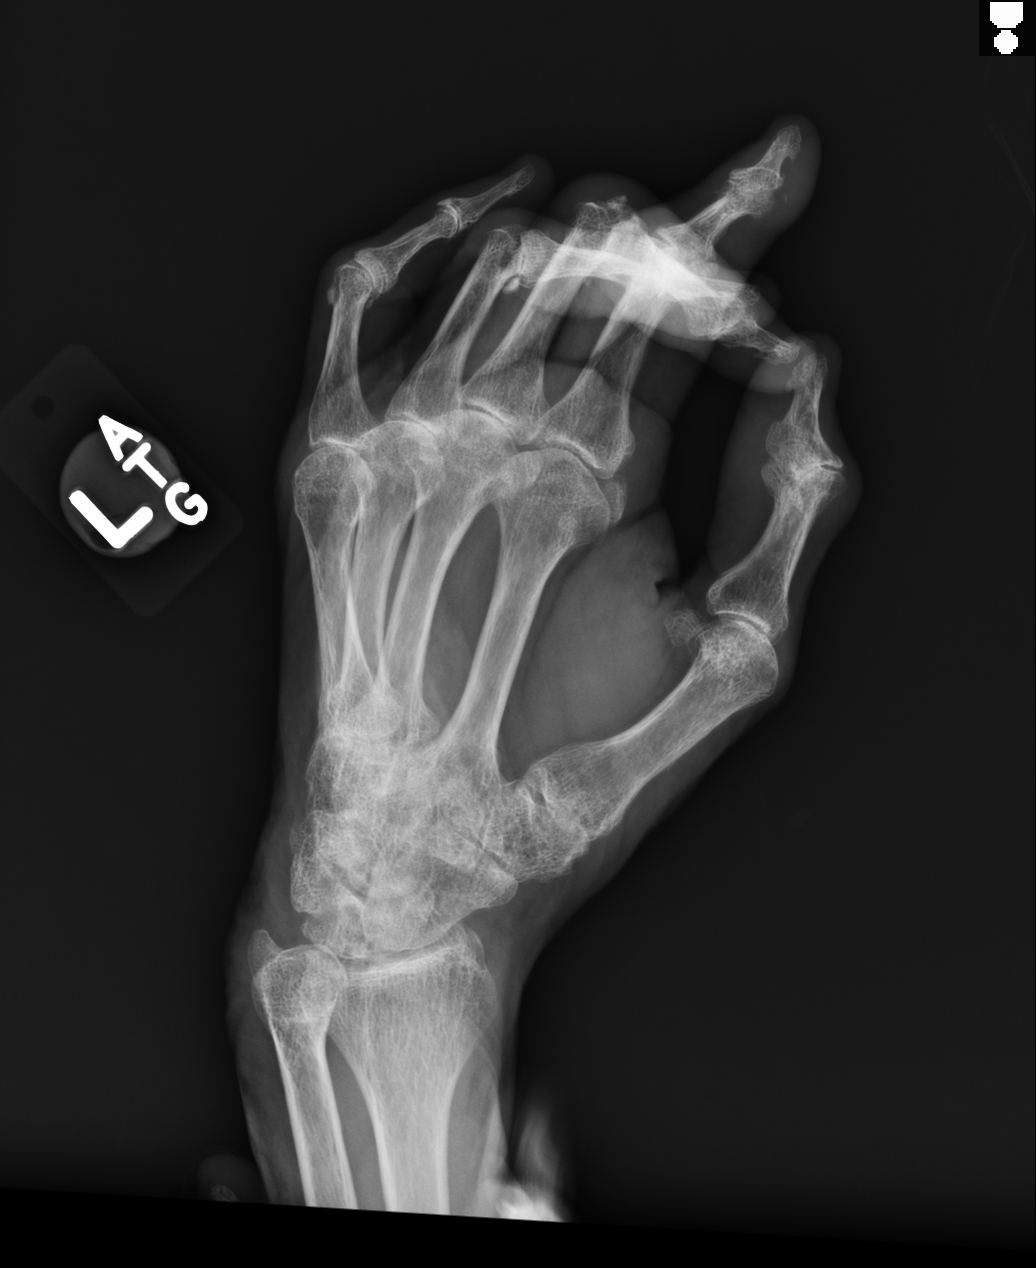
[im 3/3]
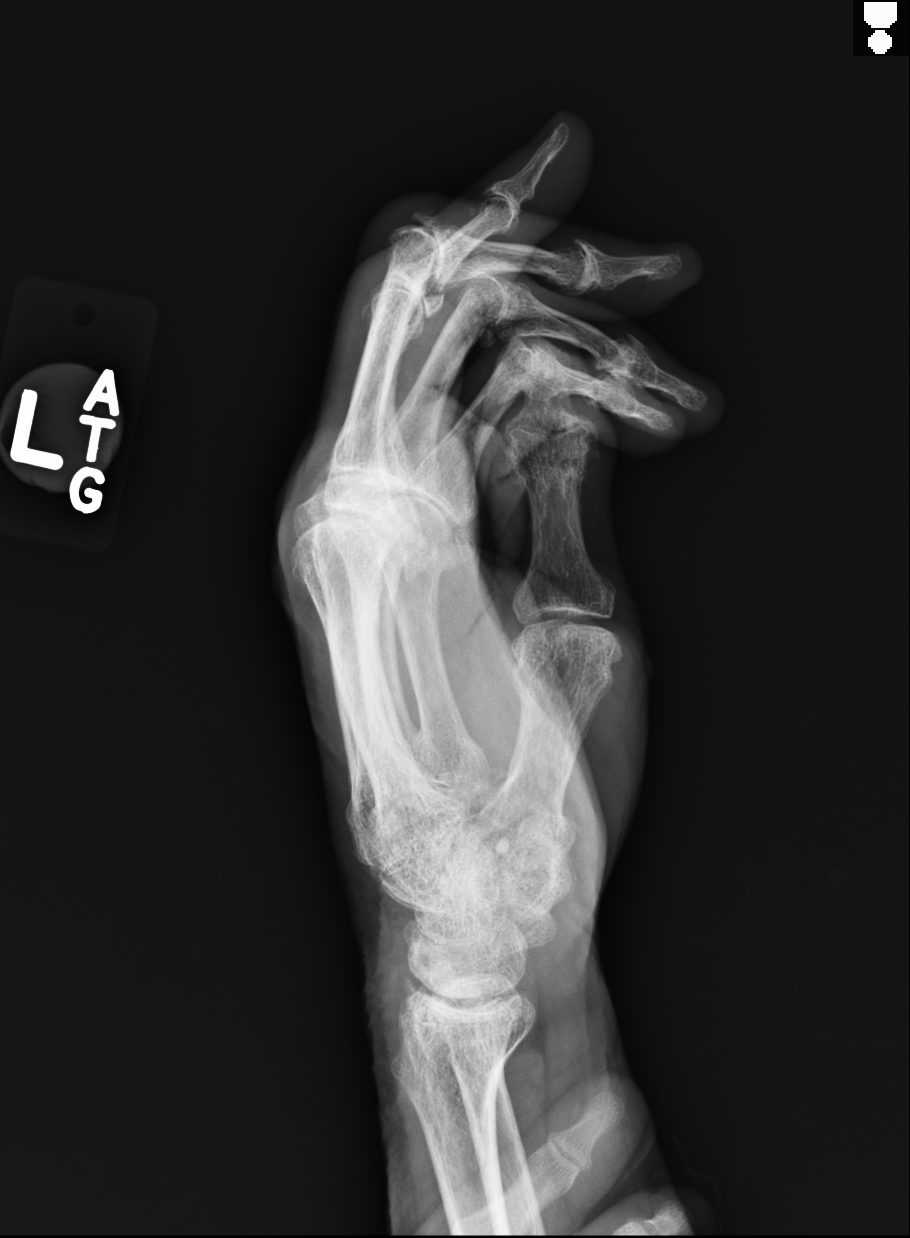

[3 of 3 positions shown; findings below may reference images not displayed]

FINDINGS: Bone mineralization is within normal limits for age. Distal left
radius and ulna appear intact, there is a tiny chronic appearing
ossific fragment at the level of the triangular fibrocartilage.
Radiocarpal joint space loss and subchondral sclerosis. Distal
radial row joint space loss and subchondral sclerosis. Carpal bone
alignment otherwise within normal limits.

Advanced MCP and distal joint space loss with osteophytosis and
subchondral sclerosis throughout the left hand. There is
superimposed soft tissue swelling at the second and third PIP is. On
the lateral view there may be a fracture through the dorsal
osteophyte at the base of the third middle phalanx. Otherwise no
acute fracture or dislocation identified.
IMPRESSION: Advanced osteoarthritis in the left hand. Possible acute fracture
through the dorsal osteophyte at the base of the third middle
phalanx. No other acute fracture or dislocation identified about the
left hand.

## 2016-09-07 ENCOUNTER — Other Ambulatory Visit: Payer: Self-pay | Admitting: Family Medicine

## 2016-10-01 ENCOUNTER — Other Ambulatory Visit: Payer: Self-pay | Admitting: Family Medicine

## 2016-10-01 DIAGNOSIS — Z1231 Encounter for screening mammogram for malignant neoplasm of breast: Secondary | ICD-10-CM

## 2016-10-29 ENCOUNTER — Ambulatory Visit
Admission: RE | Admit: 2016-10-29 | Discharge: 2016-10-29 | Disposition: A | Discharge status: Home / Self Care | Payer: Medicare Other | Source: Ambulatory Visit | Attending: Family Medicine | Admitting: Family Medicine

## 2016-10-29 DIAGNOSIS — Z1231 Encounter for screening mammogram for malignant neoplasm of breast: Secondary | ICD-10-CM

## 2016-12-28 ENCOUNTER — Encounter: Payer: Self-pay | Admitting: *Deleted

## 2016-12-28 ENCOUNTER — Emergency Department
Admission: EM | Admit: 2016-12-28 | Discharge: 2016-12-28 | Disposition: A | Discharge status: Other Acute Inpatient Hospital | Payer: Medicare Other | Attending: Emergency Medicine | Admitting: Emergency Medicine

## 2016-12-28 ENCOUNTER — Ambulatory Visit
Admission: EM | Admit: 2016-12-28 | Discharge: 2016-12-28 | Disposition: A | Discharge status: Home / Self Care | Payer: Medicare Other | Source: Home / Self Care | Attending: Emergency Medicine | Admitting: Emergency Medicine

## 2016-12-28 ENCOUNTER — Encounter (HOSPITAL_COMMUNITY): Payer: Self-pay | Admitting: Emergency Medicine

## 2016-12-28 ENCOUNTER — Emergency Department: Payer: Medicare Other

## 2016-12-28 ENCOUNTER — Observation Stay (HOSPITAL_COMMUNITY)
Admission: EM | Admit: 2016-12-28 | Discharge: 2017-01-01 | Disposition: A | Discharge status: Home / Self Care | Payer: Medicare Other | Attending: General Surgery | Admitting: General Surgery

## 2016-12-28 DIAGNOSIS — F209 Schizophrenia, unspecified: Secondary | ICD-10-CM | POA: Diagnosis not present

## 2016-12-28 DIAGNOSIS — R339 Retention of urine, unspecified: Secondary | ICD-10-CM

## 2016-12-28 DIAGNOSIS — E031 Congenital hypothyroidism without goiter: Secondary | ICD-10-CM | POA: Insufficient documentation

## 2016-12-28 DIAGNOSIS — Y33XXXA Other specified events, undetermined intent, initial encounter: Secondary | ICD-10-CM | POA: Diagnosis not present

## 2016-12-28 DIAGNOSIS — Z888 Allergy status to other drugs, medicaments and biological substances status: Secondary | ICD-10-CM | POA: Insufficient documentation

## 2016-12-28 DIAGNOSIS — Y999 Unspecified external cause status: Secondary | ICD-10-CM | POA: Diagnosis not present

## 2016-12-28 DIAGNOSIS — E039 Hypothyroidism, unspecified: Secondary | ICD-10-CM | POA: Diagnosis not present

## 2016-12-28 DIAGNOSIS — R630 Anorexia: Secondary | ICD-10-CM | POA: Diagnosis not present

## 2016-12-28 DIAGNOSIS — Z882 Allergy status to sulfonamides status: Secondary | ICD-10-CM | POA: Insufficient documentation

## 2016-12-28 DIAGNOSIS — Z79899 Other long term (current) drug therapy: Secondary | ICD-10-CM | POA: Insufficient documentation

## 2016-12-28 DIAGNOSIS — Z7982 Long term (current) use of aspirin: Secondary | ICD-10-CM | POA: Insufficient documentation

## 2016-12-28 DIAGNOSIS — F72 Severe intellectual disabilities: Secondary | ICD-10-CM | POA: Diagnosis not present

## 2016-12-28 DIAGNOSIS — F329 Major depressive disorder, single episode, unspecified: Secondary | ICD-10-CM | POA: Insufficient documentation

## 2016-12-28 DIAGNOSIS — S36029A Unspecified contusion of spleen, initial encounter: Secondary | ICD-10-CM | POA: Insufficient documentation

## 2016-12-28 DIAGNOSIS — Q059 Spina bifida, unspecified: Secondary | ICD-10-CM | POA: Diagnosis not present

## 2016-12-28 DIAGNOSIS — F73 Profound intellectual disabilities: Secondary | ICD-10-CM | POA: Insufficient documentation

## 2016-12-28 DIAGNOSIS — Y939 Activity, unspecified: Secondary | ICD-10-CM | POA: Insufficient documentation

## 2016-12-28 DIAGNOSIS — G40409 Other generalized epilepsy and epileptic syndromes, not intractable, without status epilepticus: Secondary | ICD-10-CM | POA: Diagnosis not present

## 2016-12-28 DIAGNOSIS — Y929 Unspecified place or not applicable: Secondary | ICD-10-CM | POA: Insufficient documentation

## 2016-12-28 DIAGNOSIS — Z993 Dependence on wheelchair: Secondary | ICD-10-CM | POA: Insufficient documentation

## 2016-12-28 DIAGNOSIS — D735 Infarction of spleen: Secondary | ICD-10-CM | POA: Diagnosis not present

## 2016-12-28 LAB — URINALYSIS, COMPLETE (UACMP) WITH MICROSCOPIC
BACTERIA UA: NONE SEEN
BILIRUBIN URINE: NEGATIVE
Glucose, UA: NEGATIVE mg/dL
Ketones, ur: NEGATIVE mg/dL
LEUKOCYTES UA: NEGATIVE
NITRITE: NEGATIVE
PROTEIN: NEGATIVE mg/dL
Specific Gravity, Urine: 1.019 (ref 1.005–1.030)
pH: 6 (ref 5.0–8.0)

## 2016-12-28 LAB — COMPREHENSIVE METABOLIC PANEL
ALT: 20 U/L (ref 14–54)
AST: 33 U/L (ref 15–41)
Albumin: 4 g/dL (ref 3.5–5.0)
Alkaline Phosphatase: 83 U/L (ref 38–126)
Anion gap: 6 (ref 5–15)
BUN: 30 mg/dL — ABNORMAL HIGH (ref 6–20)
CHLORIDE: 116 mmol/L — AB (ref 101–111)
CO2: 26 mmol/L (ref 22–32)
CREATININE: 0.49 mg/dL (ref 0.44–1.00)
Calcium: 9.3 mg/dL (ref 8.9–10.3)
GFR calc non Af Amer: >60 mL/min (ref >60)
Glucose, Bld: 96 mg/dL (ref 65–99)
Potassium: 4.1 mmol/L (ref 3.5–5.1)
SODIUM: 148 mmol/L — AB (ref 135–145)
Total Bilirubin: 0.9 mg/dL (ref 0.3–1.2)
Total Protein: 7.2 g/dL (ref 6.5–8.1)

## 2016-12-28 LAB — CBC WITH DIFFERENTIAL/PLATELET
Basophils Absolute: 0 10*3/uL (ref 0–0.1)
Basophils Relative: 0 %
EOS ABS: 0.2 10*3/uL (ref 0–0.7)
EOS PCT: 2 %
HCT: 38.5 % (ref 35.0–47.0)
Hemoglobin: 13 g/dL (ref 12.0–16.0)
LYMPHS ABS: 1.6 10*3/uL (ref 1.0–3.6)
Lymphocytes Relative: 17 %
MCH: 33.7 pg (ref 26.0–34.0)
MCHC: 33.7 g/dL (ref 32.0–36.0)
MCV: 99.9 fL (ref 80.0–100.0)
Monocytes Absolute: 0.9 10*3/uL (ref 0.2–0.9)
Monocytes Relative: 10 %
Neutro Abs: 6.5 10*3/uL (ref 1.4–6.5)
Neutrophils Relative %: 71 %
PLATELETS: 343 10*3/uL (ref 150–440)
RBC: 3.85 MIL/uL (ref 3.80–5.20)
RDW: 15.9 % — ABNORMAL HIGH (ref 11.5–14.5)
WBC: 9.2 10*3/uL (ref 3.6–11.0)

## 2016-12-28 MED ORDER — LEVOTHYROXINE SODIUM 25 MCG PO TABS
25.0000 ug | ORAL_TABLET | Freq: Every day | ORAL | Status: DC
  Administered 2016-12-29 – 2017-01-01 (×4): 25 ug via ORAL
  Filled 2016-12-28 (×4): qty 1

## 2016-12-28 MED ORDER — ACETAMINOPHEN 500 MG PO TABS
500.0000 mg | ORAL_TABLET | Freq: Every day | ORAL | Status: DC
  Administered 2016-12-29 – 2016-12-31 (×3): 500 mg via ORAL
  Filled 2016-12-28 (×3): qty 1

## 2016-12-28 MED ORDER — CARBAMIDE PEROXIDE 6.5 % OT SOLN: 5.0000 [drp] | OTIC | Status: DC

## 2016-12-28 MED ORDER — IOPAMIDOL (ISOVUE-300) INJECTION 61%: 30.0000 mL | Freq: Once | INTRAVENOUS | Status: DC

## 2016-12-28 MED ORDER — BISACODYL 10 MG RE SUPP
10.0000 mg | RECTAL | Status: DC
  Administered 2016-12-31: 10 mg via RECTAL
  Filled 2016-12-28: qty 1

## 2016-12-28 MED ORDER — SODIUM CHLORIDE 0.9 % IV BOLUS (SEPSIS)
1000.0000 mL | Freq: Once | INTRAVENOUS | Status: AC
  Administered 2016-12-28: 1000 mL via INTRAVENOUS

## 2016-12-28 MED ORDER — MIRTAZAPINE 7.5 MG PO TABS
7.5000 mg | ORAL_TABLET | Freq: Every day | ORAL | Status: DC
  Administered 2016-12-29 – 2016-12-31 (×3): 7.5 mg via ORAL
  Filled 2016-12-28 (×4): qty 1

## 2016-12-28 MED ORDER — FAMOTIDINE 20 MG PO TABS
20.0000 mg | ORAL_TABLET | Freq: Every day | ORAL | Status: DC
  Administered 2016-12-29 – 2017-01-01 (×4): 20 mg via ORAL
  Filled 2016-12-28 (×4): qty 1

## 2016-12-28 MED ORDER — PANTOPRAZOLE SODIUM 40 MG IV SOLR
40.0000 mg | Freq: Every day | INTRAVENOUS | Status: DC
  Administered 2016-12-29 – 2017-01-01 (×3): 40 mg via INTRAVENOUS
  Filled 2016-12-28 (×3): qty 40

## 2016-12-28 MED ORDER — OLANZAPINE 5 MG PO TABS
5.0000 mg | ORAL_TABLET | Freq: Every day | ORAL | Status: DC
  Administered 2016-12-29 – 2017-01-01 (×4): 5 mg via ORAL
  Filled 2016-12-28 (×4): qty 1

## 2016-12-28 MED ORDER — IOPAMIDOL (ISOVUE-300) INJECTION 61%
75.0000 mL | Freq: Once | INTRAVENOUS | Status: AC | PRN
  Administered 2016-12-28: 75 mL via INTRAVENOUS

## 2016-12-28 MED ORDER — PANTOPRAZOLE SODIUM 40 MG PO TBEC
40.0000 mg | DELAYED_RELEASE_TABLET | Freq: Every day | ORAL | Status: DC
  Administered 2016-12-30: 40 mg via ORAL
  Filled 2016-12-28 (×2): qty 1

## 2016-12-28 MED ORDER — OLANZAPINE 5 MG PO TABS
5.0000 mg | ORAL_TABLET | Freq: Every day | ORAL | Status: DC
  Administered 2016-12-29 – 2016-12-31 (×3): 5 mg via ORAL
  Filled 2016-12-28 (×3): qty 1

## 2016-12-28 MED ORDER — LORAZEPAM 0.5 MG PO TABS
0.5000 mg | ORAL_TABLET | Freq: Every day | ORAL | Status: DC | PRN
  Administered 2016-12-31: 0.5 mg via ORAL
  Filled 2016-12-28: qty 1

## 2016-12-28 MED ORDER — LEVETIRACETAM 250 MG PO TABS
250.0000 mg | ORAL_TABLET | Freq: Two times a day (BID) | ORAL | Status: DC
  Administered 2016-12-29: 250 mg via ORAL
  Filled 2016-12-28 (×3): qty 1

## 2016-12-28 MED ORDER — SODIUM CHLORIDE 0.9 % IV SOLN: INTRAVENOUS | Status: DC

## 2016-12-28 NOTE — H&P (Signed)
[name] is an 76 y.o. female.   Chief Complaint: The patient has no complaints as she is mentally disabled and cannot communicate.  She has no one with her.  She was transferred to [place] for a splenic subcapsular hematoma that was detected on a CT scan that was done for reasons that are unclear.  When the surgeon at [facility] was asked to see her, he did not and requested to transfer her to [facility] because "I have never done a splenectomy and don't know how".  He did not go see the patient, examine the patient or inquire at all about the patients stability or overall code status.  She was hemodynamically stable and had a normal hemoglobin  HPI: Unknown why she was brought to the ED at [facility], but was found to have a fairly large subcapsular splenic hematoma that made the surgeon on call at [facility] uncomfortable.  I agreed to accept the patient in transfer when the surgeon confessed that he did not know how to perform a splenectomy.  She does not need a splenectomy and probably will not.  Past Medical History:  Diagnosis Date  . Depression   . Hypothyroidism   . MR (mental retardation)   . Schizophrenia ([place])   . Seizure ([place])   . Spinal bifida, closed     History reviewed. No pertinent surgical history.  History reviewed. No pertinent family history. Social History:  reports that she has never smoked. She has never used smokeless tobacco. She reports that she does not drink alcohol or use drugs.  Allergies:  Allergies  Allergen Reactions  . Sulfa Antibiotics   . Tegretol [Carbamazepine]      (Not in a hospital admission)  Results for orders placed or performed during the hospital encounter of 12/28/16 (from the past 48 hour(s))  CBC with Differential     Status: Abnormal   Collection Time: 12/28/16  3:57 PM  Result Value Ref Range   WBC 9.2 3.6 - 11.0 K/uL   RBC 3.85 3.80 - 5.20 MIL/uL   Hemoglobin 13.0 12.0 - 16.0 g/dL   HCT 38.5 35.0 - 47.0 %   MCV 99.9 80.0 - 100.0 fL   MCH 33.7  26.0 - 34.0 pg   MCHC 33.7 32.0 - 36.0 g/dL   RDW 15.9 (H) 11.5 - 14.5 %   Platelets 343 150 - 440 K/uL   Neutrophils Relative % 71 %   Neutro Abs 6.5 1.4 - 6.5 K/uL   Lymphocytes Relative 17 %   Lymphs Abs 1.6 1.0 - 3.6 K/uL   Monocytes Relative 10 %   Monocytes Absolute 0.9 0.2 - 0.9 K/uL   Eosinophils Relative 2 %   Eosinophils Absolute 0.2 0 - 0.7 K/uL   Basophils Relative 0 %   Basophils Absolute 0.0 0 - 0.1 K/uL  Comprehensive metabolic panel     Status: Abnormal   Collection Time: 12/28/16  3:57 PM  Result Value Ref Range   Sodium 148 (H) 135 - 145 mmol/L   Potassium 4.1 3.5 - 5.1 mmol/L   Chloride 116 (H) 101 - 111 mmol/L   CO2 26 22 - 32 mmol/L   Glucose, Bld 96 65 - 99 mg/dL   BUN 30 (H) 6 - 20 mg/dL   Creatinine, Ser 0.49 0.44 - 1.00 mg/dL   Calcium 9.3 8.9 - 10.3 mg/dL   Total Protein 7.2 6.5 - 8.1 g/dL   Albumin 4.0 3.5 - 5.0 g/dL   AST 33 15 -  41 U/L   ALT 20 14 - 54 U/L   Alkaline Phosphatase 83 38 - 126 U/L   Total Bilirubin 0.9 0.3 - 1.2 mg/dL   GFR calc non Af Amer >60 >60 mL/min   GFR calc Af Amer >60 >60 mL/min    Comment: (NOTE) The eGFR has been calculated using the CKD EPI equation. This calculation has not been validated in all clinical situations. eGFR's persistently <60 mL/min signify possible Chronic Kidney Disease.    Anion gap 6 5 - 15  Urinalysis, Complete w Microscopic     Status: Abnormal   Collection Time: 12/28/16  4:05 PM  Result Value Ref Range   Color, Urine YELLOW (A) YELLOW   APPearance CLEAR (A) CLEAR   Specific Gravity, Urine 1.019 1.005 - 1.030   pH 6.0 5.0 - 8.0   Glucose, UA NEGATIVE NEGATIVE mg/dL   Hgb urine dipstick SMALL (A) NEGATIVE   Bilirubin Urine NEGATIVE NEGATIVE   Ketones, ur NEGATIVE NEGATIVE mg/dL   Protein, ur NEGATIVE NEGATIVE mg/dL   Nitrite NEGATIVE NEGATIVE   Leukocytes, UA NEGATIVE NEGATIVE   RBC / HPF 0-5 0 - 5 RBC/hpf   WBC, UA 0-5 0 - 5 WBC/hpf   Bacteria, UA NONE SEEN NONE SEEN   Squamous  Epithelial / LPF 0-5 (A) NONE SEEN   Mucous PRESENT    Ct Abdomen Pelvis W Contrast  Result Date: 12/28/2016 CLINICAL DATA:  Abdominal pain, signs and symptoms of urinary retention. EXAM: CT ABDOMEN AND PELVIS WITH CONTRAST TECHNIQUE: Multidetector CT imaging of the abdomen and pelvis was performed using the standard protocol following bolus administration of intravenous contrast. CONTRAST:  65m ISOVUE-300 IOPAMIDOL (ISOVUE-300) INJECTION 61% COMPARISON:  04/10/2008 ultrasound, abdomen radiographs from 04/16/2008 FINDINGS: Lower chest: Chronic elevation of the left hemidiaphragm. The visualized cardiac chambers are normal in size. No pericardial effusion. There is atelectasis at each lung base. Hepatobiliary: Coarse benign-appearing calcifications the right hepatic lobe. Cholecystectomy with mild reservoir effect likely accounting for mild intrahepatic and extrahepatic ductal dilatation. Pancreas: Atrophy without ductal dilatation. Small 9 mm pancreatic head cyst is noted. Spleen: There is a subcapsular hematoma measuring 13.6 x 4.8 x 8.8 cm (CC by transverse by AP) faint internal hyperdensity may reflect areas of clot suggesting a subacute hematoma. No active hemorrhage identified. Adrenals/Urinary Tract: Normal bilateral adrenal glands and kidneys without obstructive uropathy. The urinary bladder is slightly thickened in appearance with irregular mucosal enhancement which may reflect a cystitis. Stomach/Bowel: The stomach is contracted. There is normal small bowel rotation. No bowel obstruction or inflammation. Moderate colonic stool burden. Appendix is not confidently identified. Vascular/Lymphatic: Bold aortoiliac atherosclerosis without aneurysm. Reproductive: Calcified uterine fibroids.  No adnexal masses. Other: No abdominal wall hernia or abnormality. No abdominopelvic ascites. Musculoskeletal: Changes of spina bifida along the lower lumbar spine with dextroconvex curvature. IMPRESSION: 1. Large  splenic subcapsular hematoma measuring 13.6 x 4.8 x 8.8 cm without evidence of active hemorrhage. Mixed attenuation within may represent areas of clot suggesting a subacute injury. No free fluid. 2. Mildly thickened bladder wall with mucosal enhancement suggest cystitis. 3. Status post cholecystectomy with mild reservoir effect. 4. 9 mm pancreatic head cyst. Recommend follow up pre and post contrast MRI/MRCP or pancreatic protocol CT in 2 years. This recommendation follows ACR consensus guidelines: Management of Incidental Pancreatic Cysts: A White Paper of the ACR Incidental Findings Committee. [id number] Electronically Signed   By: DAshley RoyaltyM.D.   On: 12/28/2016 18:08    Review  of Systems  Unable to perform ROS: Mental acuity    Blood pressure (!) 161/101, pulse 66, temperature 97.8 F (36.6 C), temperature source Oral, resp. rate 16, height 5' (1.524 m), weight 59 kg (130 lb), SpO2 96 %. Physical Exam  Vitals reviewed. Constitutional: Vital signs are normal. She appears cachectic.  Non-toxic appearance. She does not have a sickly appearance.  HENT:  Head: Normocephalic and atraumatic.  Cardiovascular: Normal rate, regular rhythm and normal heart sounds.   Respiratory: Effort normal and breath sounds normal.  GI: Soft. Bowel sounds are normal. There is splenomegaly (questionable palpable spleen in the LUQ). There is no tenderness. There is no rigidity, no rebound and no guarding.  Musculoskeletal: Normal range of motion.  Neurological: She is unresponsive. GCS eye subscore is 4. GCS verbal subscore is 1. GCS motor subscore is 3.  Skin: Skin is warm and dry.  Psychiatric: Her affect is blunt.     Assessment/Plan Patient from a group homoe/SNF who was brought into [facility] for unknown reasons, got a CT of the abdomen which demonstrated a large subcapsular splenic hematoma.  She was transferred to [facility] because of lack of adequate surgical coverage--the surgeon on call did  not feel comfortable watching this patient with the potential of having to perform a splenectomy because he has never done one.    Currently the patient does not need surgery.  It is unknown how long the patient may have had this condition, but she is completely hemodynamically stable and has no free fluid from the spleen.    Her code status is unknown.  She has a caregiver with power of attorney.    She will be admitted for observation.   Judeth Horn, MD 12/28/2016, 10:15 PM

## 2016-12-28 NOTE — ED Notes (Signed)
Pt's guardian is Norberto SorensonSandra Jones. She can be reached 24/7 on her cell at [phone].  She said if anyone needs to contact her, day or night, don't hesitate to call her.

## 2016-12-28 NOTE — ED Notes (Signed)
[name] (coordinator at [facility]) provided phone numbers of nurse's at the facility in the event they need to be reached:  Vilma Praderonna Steel: cell [phone] home [phone] On call: [phone]

## 2016-12-28 NOTE — ED Provider Notes (Signed)
HPI  SUBJECTIVE:  [name] is a 76 y.o. female who presents with decreased appetite for the past 2 weeks, decreased urine output. Caregiver states that the patient is urinating small amounts every 12-24 hours. They also state the patient appears to frequently be in pain, but she is unable to locate where the pain is whether it is in her back, abdomen. It seems to be worse after eating. they tried Tylenol without improvement of symptoms. No alleviating factors. Caregivers report dark, cloudy, odorous urine. They're not sure if the patient has had any urinary incontinence because she wears a diaper. They're not sure if patient has dysuria, urgency, frequency. No noted hematuria the caregiver showed me a picture of the patient's urine which appears to be tea colored. No vomiting, fevers, altered mental status, change in medicines, diarrhea. Patient is not on any opiates. No coughing, wheezing, increased work of breathing. No sores. Patient is stooling normally- last BM today. No antipyretic in the past 6-8 hours. Patient is nonverbal, all history obtained through caregivers and outside record review. Past medical history severe mental retardation, seizures, UTIs. No history of pyelonephritis, pneumonia. Patient was in a group home. PMD: Marina Goodell, MD   Pt seen by PMD on 6/5, thought to have a bacterial UTI, started on Cefdinir. Unable to see note on 6/13  Previous urine culture on 5/11 grew out Proteus which is sensitive to cephalosporins, Levaquin and Bactrim, resistant to Macrobid and indeterminate for ciprofloxacin  Past Medical History:  Diagnosis Date  . Depression   . Hypothyroidism   . MR (mental retardation)   . Schizophrenia ([facility])   . Seizure ([facility])   . Spinal bifida, closed     History reviewed. No pertinent surgical history.  History reviewed. No pertinent family history.  Social History  Substance Use Topics  . Smoking status: Never Smoker  . Smokeless tobacco:  Never Used  . Alcohol use No    No current facility-administered medications for this encounter.   Current Outpatient Prescriptions:  .  aspirin 81 MG tablet, Take 81 mg by mouth daily., Disp: , Rfl:  .  bacitracin ointment, Apply 1 application topically 2 (two) times daily., Disp: , Rfl:  .  bisacodyl (DULCOLAX) 10 MG suppository, Place 10 mg rectally 2 (two) times a week., Disp: , Rfl:  .  Calcium Carb-Cholecalciferol (CALCIUM 600 + D PO), Take by mouth 2 (two) times daily., Disp: , Rfl:  .  carbamide peroxide (DEBROX) 6.5 % otic solution, Place 5 drops into both ears every 21 ( twenty-one) days., Disp: , Rfl:  .  cetirizine (ZYRTEC) 10 MG tablet, Take 10 mg by mouth daily as needed for allergies. , Disp: , Rfl:  .  Diphenhyd-Hydrocort-Nystatin (FIRST-DUKES MOUTHWASH MT), Use as directed in the mouth or throat., Disp: , Rfl:  .  docusate (COLACE) 60 MG/15ML syrup, Take 100 mg by mouth daily. , Disp: , Rfl:  .  folic acid (FOLVITE) 1 MG tablet, Take 1 mg by mouth daily., Disp: , Rfl:  .  Hydroactive Dressings (DUODERM HYDROACTIVE) MISC, Apply topically as needed., Disp: , Rfl:  .  lamoTRIgine (LAMICTAL) 100 MG tablet, 100 mg. 100 mg in the morning  50 mg at bedtime, Disp: , Rfl:  .  levothyroxine (SYNTHROID, LEVOTHROID) 25 MCG tablet, 0.25 mcg daily. Take .5 daily, Disp: , Rfl:  .  LORazepam (ATIVAN) 0.5 MG tablet, Take 0.5 mg by mouth as needed for anxiety., Disp: , Rfl:  .  mirtazapine (REMERON)  15 MG tablet, at bedtime., Disp: , Rfl:  .  OLANZapine (ZYPREXA) 5 MG tablet, Take 5 mg by mouth at bedtime. 1 tab PO as crisis med for agitation greater than 10 minutes, with approval of RN, per the Behavior support plan, Disp: , Rfl:  .  ranitidine (ZANTAC) 150 MG tablet, 150 mg daily., Disp: , Rfl:  .  Skin Protectants, Misc. (BALMEX SKIN PROTECTANT) OINT, Apply topically as needed., Disp: , Rfl:  .  trypsin-balsam-castor oil (GRANULEX) topical spray, Apply 1 application topically as needed for  wound care., Disp: , Rfl:  .  acetaminophen (TYLENOL) 500 MG tablet, Take 500 mg by mouth at bedtime. Take 325 2 tabs every 4 hours for temp or pain. Make sure to space 4-6 hours from bedtime dose., Disp: , Rfl:  .  KEPPRA 250 MG tablet, Take 1 tablet (250 mg total) by mouth 2 (two) times daily., Disp: 60 tablet, Rfl: 11 .  OLANZAPINE PO, Take 2.5 mg by mouth daily. @ 7pm, Disp: , Rfl:   Allergies  Allergen Reactions  . Sulfa Antibiotics   . Tegretol [Carbamazepine]      ROS  As noted in HPI.   Physical Exam  BP (!) 160/95 (BP Location: Left Arm)   Pulse 80   Resp 16   Ht 5' (1.524 m)   Wt 130 lb (59 kg)   SpO2 98%   BMI 25.39 kg/m   Constitutional: Well developed, well nourished,Appears uncomfortable  Eyes:  EOMI, conjunctiva normal bilaterally HENT: Normocephalic, atraumatic,mucus membranes moist Respiratory: Or inspiratory effort and appreciable wheezes, rales, rhonchi Cardiovascular: Normal rate regular rhythm no murmurs rubs or gallops  GI: nondistended soft. Positive suprapubic and lower abdominal tenderness tenderness. Questionable palpable mass active bowel sounds. Negative Murphy, negative McBurney.  Back: No CVAT  Skin: No rash, skin intact Musculoskeletal: no deformities Neurologic: At baseline mental status per caregivers, no focal neuro deficits Psychiatric: behavior appropriate   ED Course   Medications - No data to display  Orders Placed This Encounter  Procedures  . Urinalysis, Complete w Microscopic    Standing Status:   Standing    Number of Occurrences:   1    No results found for this or any previous visit (from the past 24 hour(s)). No results found.  ED Clinical Impression  Urinary retention  Decreased appetite   ED Assessment/Plan  Previous outside records reviewed. As noted in history of present illness.   Patient does have a palpable lower abdominal mass which is concerning for urinary retention, however we do not have a  bladder scan or Foley catheter here to measure out PVR. Also concern for dehydration and failure to thrive given decreased appetite for the past 2 weeks and decreased urine output. She apparently is not responding to the Cefdinir for the UTI. Feel the patient would benefit from a comprehensive ED workup. Feel the patient is stable to go by private vehicle. Discussed rationale for transfer to the ED with caregivers. They agree to go to the [facility]. Notifying the department- d/w triage RN  Judeth CornfieldStephanie.  No orders of the defined types were placed in this encounter.   *This clinic note was created using Dragon dictation software. Therefore, there may be occasional mistakes despite careful proofreading.  ?   Domenick GongMortenson, Chantz Montefusco, MD 12/28/16 1245

## 2016-12-28 NOTE — ED Provider Notes (Signed)
Patient was transferred here from [facility] emergency department for large splenic subcapsular hematoma.   Patient has a history of schizophrenia and mental retardation. Unable to obtain subsequent history or assess patient's pain.  Dr. Lindie SpruceWyatt of trauma surgery is already aware the patient and will evaluate the patient in the emergency department for admission.   Nira Connardama, Pedro Eduardo, MD 12/28/16 2246

## 2016-12-28 NOTE — ED Notes (Signed)
Per [facility], staffing will come sit with pt beginning at 9 AM tomorrow morning.

## 2016-12-28 NOTE — ED Triage Notes (Signed)
Patient started having symptoms of loss of appetite and urinary retention 1 week ago.

## 2016-12-28 NOTE — Discharge Instructions (Signed)
Go immediately to the [facility] emergency department. She may need a bladder scan, Foley catheter and a more comprehensive workup to make sure that there is no other reason for her decreased appetite. She may also be dehydrated.

## 2016-12-28 NOTE — ED Triage Notes (Signed)
Per EMS, patient is being transferred from [facility].  [facility]  Initial c/o urinary retention.  Determined patient was dehydrated and given 1L of NS.  It has been determine that patient has been hitting self in belly and has caused a hematoma to spleen.  22G LH.  185/95, 83 HR, RR 18, 97% RA.  Hx of seizures, hypertension, hypothyroidism, and spina bifida.

## 2016-12-28 NOTE — ED Provider Notes (Signed)
[facility] Emergency Department Provider Note  ____________________________________________   I have reviewed the triage vital signs and the nursing notes.   HISTORY  Chief Complaint Urinary Retention   History limited by: MR, staff with patient at this time not primary caregiver, primary nurse phone went straight to answering machine, most history obtained from urgent care chart   HPI [name] is a 76 y.o. female who presents to the emergency department today because of concern for decreased urination, decreased oral intake. This has apparently been going on for the past 2 weeks. The patient has apparently only been having small amount of wet diapers. The patient has apparently also has been exhibiting signs of pain of unclear etiology. Patient was recently treated for UTI. No fevers. Per staff with patient currently she is at her baseline behavior and mental capacity.   Past Medical History:  Diagnosis Date  . Depression   . Hypothyroidism   . MR (mental retardation)   . Schizophrenia (HCC)   . Seizure (HCC)   . Spinal bifida, closed     Patient Active Problem List   Diagnosis Date Noted  . Open wound of lesser toe of left foot with damage to nail 04/25/2015  . Seizure disorder (HCC) 05/28/2014  . Mental retardation, idiopathic profound 05/28/2014  . Spina bifida cystica (HCC) 05/28/2014  . 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 No pertinent surgical history.  Prior to Admission medications   Medication Sig Start Date End Date Taking? Authorizing Provider  acetaminophen (TYLENOL) 500 MG tablet Take 500 mg by mouth at bedtime. Take 325 2 tabs every 4 hours for temp or pain. Make sure to space 4-6 hours from bedtime dose.    [provider]  aspirin 81 MG tablet Take 81 mg by  mouth daily.    [provider]  bacitracin ointment Apply 1 application topically 2 (two) times daily.    [provider]  bisacodyl (DULCOLAX) 10 MG suppository Place 10 mg rectally 2 (two) times a week.    [provider]  Calcium Carb-Cholecalciferol (CALCIUM 600 + D PO) Take by mouth 2 (two) times daily.    [provider]  carbamide peroxide (DEBROX) 6.5 % otic solution Place 5 drops into both ears every 21 ( twenty-one) days.    [provider]  cetirizine (ZYRTEC) 10 MG tablet Take 10 mg by mouth daily as needed for allergies.     [provider]  Diphenhyd-Hydrocort-Nystatin (FIRST-DUKES MOUTHWASH MT) Use as directed in the mouth or throat.    [provider]  docusate (COLACE) 60 MG/15ML syrup Take 100 mg by mouth daily.     [provider]  folic acid (FOLVITE) 1 MG tablet Take 1 mg by mouth daily.    [provider]  Hydroactive Dressings (DUODERM HYDROACTIVE) MISC Apply topically as needed.    [provider]  KEPPRA 250 MG tablet Take 1 tablet (250 mg total) by mouth 2 (two) times daily. 05/29/16   Butch PennyMillikan, Megan, NP  lamoTRIgine (LAMICTAL) 100 MG tablet 100 mg. 100 mg in the morning  50 mg at bedtime 05/21/13   [provider]  levothyroxine (SYNTHROID, LEVOTHROID) 25 MCG tablet 0.25 mcg daily. Take .5 daily 05/21/13   [provider]  LORazepam (ATIVAN) 0.5 MG tablet Take 0.5 mg by mouth as needed for anxiety.  [provider]  mirtazapine (REMERON) 15 MG tablet at bedtime. 05/21/13   [provider]  OLANZapine (ZYPREXA) 5 MG tablet Take 5 mg by mouth at bedtime. 1 tab PO as crisis med for agitation greater than 10 minutes, with approval of RN, per the Behavior support plan    [provider]  OLANZAPINE PO Take 2.5 mg by mouth daily. @ 7pm    [provider]  ranitidine (ZANTAC) 150 MG tablet 150 mg daily. 05/21/13   [provider]   Skin Protectants, Misc. (BALMEX SKIN PROTECTANT) OINT Apply topically as needed.    [provider]  trypsin-balsam-castor oil (GRANULEX) topical spray Apply 1 application topically as needed for wound care.    [provider]    Allergies Sulfa antibiotics and Tegretol [carbamazepine]  No family history on file.  Social History Social History  Substance Use Topics  . Smoking status: Never Smoker  . Smokeless tobacco: Never Used  . Alcohol use No    Review of Systems Unable to obtain given mental status.   ____________________________________________   PHYSICAL EXAM:  VITAL SIGNS: ED Triage Vitals  Enc Vitals Group     BP 12/28/16 1327 (!) 141/98     Pulse Rate 12/28/16 1327 76     Resp 12/28/16 1327 18     Temp 12/28/16 1327 98.2 F (36.8 C)     Temp Source 12/28/16 1327 Axillary     SpO2 12/28/16 1327 98 %     Weight 12/28/16 1330 130 lb (59 kg)     Height 12/28/16 1330 5' (1.524 m)   Constitutional: Awake and alert, no acute distress.  Eyes: Conjunctivae are normal.  ENT   Head: Normocephalic and atraumatic.   Nose: No congestion/rhinnorhea.   Mouth/Throat: Mucous membranes are moist.   Neck: No stridor. Hematological/Lymphatic/Immunilogical: No cervical lymphadenopathy. Cardiovascular: Normal rate, regular rhythm.  No murmurs, rubs, or gallops.  Respiratory: Normal respiratory effort without tachypnea nor retractions. Breath sounds are clear and equal bilaterally. No wheezes/rales/rhonchi. Gastrointestinal: Soft and non tender. No rebound. No guarding.  Genitourinary: Deferred Musculoskeletal: Normal range of motion in all extremities. No lower extremity edema. Neurologic:  Not able to answer questions. Verbal. Appears to move all extremities.  Skin:  Skin is warm, dry and intact. No rash noted. Psychiatric: Mood and affect are normal. Speech and behavior are normal. Patient exhibits appropriate insight and  judgment.  ____________________________________________    LABS (pertinent positives/negatives)  Labs Reviewed  URINALYSIS, COMPLETE (UACMP) WITH MICROSCOPIC - Abnormal; Notable for the following:       Result Value   Color, Urine YELLOW (*)    APPearance CLEAR (*)    Hgb urine dipstick SMALL (*)    Squamous Epithelial / LPF 0-5 (*)    All other components within normal limits  CBC WITH DIFFERENTIAL/PLATELET - Abnormal; Notable for the following:    RDW 15.9 (*)    All other components within normal limits  COMPREHENSIVE METABOLIC PANEL - Abnormal; Notable for the following:    Sodium 148 (*)    Chloride 116 (*)    BUN 30 (*)    All other components within normal limits     ____________________________________________   EKG  None  ____________________________________________    RADIOLOGY  CT abd/pel IMPRESSION:  1. Large splenic subcapsular hematoma measuring 13.6 x 4.8 x 8.8 cm  without evidence of active hemorrhage. Mixed attenuation within may  represent areas of clot suggesting a subacute injury. No free fluid.  2. Mildly thickened bladder wall with mucosal enhancement suggest  cystitis.  3. Status post cholecystectomy with mild reservoir effect.  4. 9 mm pancreatic head cyst. Recommend follow up pre and post  contrast MRI/MRCP or pancreatic protocol CT in 2 years. This  recommendation follows ACR consensus guidelines: Management of  Incidental Pancreatic Cysts: A White Paper of the ACR Incidental  Findings Committee. J Am Coll Radiol 2017;14:911-923.     ____________________________________________   PROCEDURES  Procedures  ____________________________________________   INITIAL IMPRESSION / ASSESSMENT AND PLAN / ED COURSE  Pertinent labs & imaging results that were available during my care of the patient were reviewed by me and considered in my medical decision making (see chart for details).  Patient's workup in the emergency department here  showed a splenic hematoma. This is of unclear etiology. Discussed with Dr. Aleen Campi with surgery who recommended transfer. Arranged for transfer to [place].   ____________________________________________   FINAL CLINICAL IMPRESSION(S) / ED DIAGNOSES  Final diagnoses:  Spleen hematoma, initial encounter     Note: This dictation was prepared with Dragon dictation. Any transcriptional errors that result from this process are unintentional     Phineas Semen, MD 12/28/16 2358

## 2016-12-28 NOTE — ED Notes (Signed)
Per Dr. Lindie SpruceWyatt, began patient on 3750ml/hr of NS.

## 2016-12-28 NOTE — ED Triage Notes (Signed)
Per patient's caregiver, patient has had s/s of urinary retention, (one brief wet/day), loss of appetite, insomnia for one week. Patient is a resident of a group home.

## 2016-12-29 ENCOUNTER — Encounter (HOSPITAL_COMMUNITY): Payer: Self-pay

## 2016-12-29 DIAGNOSIS — D735 Infarction of spleen: Secondary | ICD-10-CM | POA: Diagnosis not present

## 2016-12-29 LAB — CBC
HCT: 38.7 % (ref 36.0–46.0)
HEMATOCRIT: 40.9 % (ref 36.0–46.0)
HEMOGLOBIN: 12.3 g/dL (ref 12.0–15.0)
HEMOGLOBIN: 12.7 g/dL (ref 12.0–15.0)
MCH: 32.4 pg (ref 26.0–34.0)
MCH: 32 pg (ref 26.0–34.0)
MCHC: 31.8 g/dL (ref 30.0–36.0)
MCHC: 31.1 g/dL (ref 30.0–36.0)
MCV: 101.8 fL — AB (ref 78.0–100.0)
MCV: 103 fL — AB (ref 78.0–100.0)
Platelets: 317 10*3/uL (ref 150–400)
Platelets: 331 10*3/uL (ref 150–400)
RBC: 3.8 MIL/uL — ABNORMAL LOW (ref 3.87–5.11)
RBC: 3.97 MIL/uL (ref 3.87–5.11)
RDW: 15.8 % — ABNORMAL HIGH (ref 11.5–15.5)
RDW: 15.9 % — ABNORMAL HIGH (ref 11.5–15.5)
WBC: 8.4 10*3/uL (ref 4.0–10.5)
WBC: 8 10*3/uL (ref 4.0–10.5)

## 2016-12-29 LAB — BASIC METABOLIC PANEL
ANION GAP: 11 (ref 5–15)
BUN: 20 mg/dL (ref 6–20)
CHLORIDE: 111 mmol/L (ref 101–111)
CO2: 26 mmol/L (ref 22–32)
Calcium: 9 mg/dL (ref 8.9–10.3)
Creatinine, Ser: 0.63 mg/dL (ref 0.44–1.00)
GFR calc Af Amer: >60 mL/min (ref >60)
Glucose, Bld: 90 mg/dL (ref 65–99)
Potassium: 3.8 mmol/L (ref 3.5–5.1)
SODIUM: 148 mmol/L — AB (ref 135–145)

## 2016-12-29 MED ORDER — RESOURCE THICKENUP CLEAR PO POWD
ORAL | Status: DC | PRN
  Filled 2016-12-29: qty 125

## 2016-12-29 MED ORDER — LEVETIRACETAM 100 MG/ML PO SOLN
250.0000 mg | Freq: Two times a day (BID) | ORAL | Status: DC
  Administered 2016-12-30 – 2017-01-01 (×5): 250 mg via ORAL
  Filled 2016-12-29 (×6): qty 2.5

## 2016-12-29 MED ORDER — LAMOTRIGINE 100 MG PO TABS
100.0000 mg | ORAL_TABLET | Freq: Every day | ORAL | Status: DC
  Administered 2016-12-30 – 2017-01-01 (×3): 100 mg via ORAL
  Filled 2016-12-29 (×3): qty 1

## 2016-12-29 MED ORDER — OXYCODONE-ACETAMINOPHEN 5-325 MG PO TABS
1.0000 | ORAL_TABLET | Freq: Four times a day (QID) | ORAL | Status: DC | PRN
Start: 2016-12-29 — End: 2017-01-01
  Administered 2016-12-29 – 2017-01-01 (×6): 1 via ORAL
  Filled 2016-12-29 (×6): qty 1

## 2016-12-29 MED ORDER — LAMOTRIGINE 100 MG PO TABS
50.0000 mg | ORAL_TABLET | Freq: Every day | ORAL | Status: DC
  Administered 2016-12-29 – 2016-12-31 (×3): 50 mg via ORAL
  Filled 2016-12-29 (×3): qty 1

## 2016-12-29 NOTE — Progress Notes (Addendum)
Subjective/Chief Complaint: Doing well min pain this AM Hct stable   Objective: Vital signs in last 24 hours: Temp:  [97.8 F (36.6 C)-98.6 F (37 C)] 98 F (36.7 C) (06/16 0510) Pulse Rate:  [63-84] 68 (06/16 0510) Resp:  [14-18] 18 (06/16 0510) BP: (132-192)/(64-101) 132/78 (06/16 0510) SpO2:  [95 %-100 %] 100 % (06/16 0510) Weight:  [59 kg (130 lb)] 59 kg (130 lb) (06/15 2121) Last BM Date:  (Prior to admission)  Intake/Output from previous day: No intake/output data recorded. Intake/Output this shift: No intake/output data recorded.  General appearance: alert GI: soft, min TTP LUQ, no rebound/guarding  Lab Results:   Recent Labs  12/28/16 1557 12/29/16 0518  WBC 9.2 8.4  HGB 13.0 12.3  HCT 38.5 38.7  PLT 343 317   BMET  Recent Labs  12/28/16 1557 12/29/16 0518  NA 148* 148*  K 4.1 3.8  CL 116* 111  CO2 26 26  GLUCOSE 96 90  BUN 30* 20  CREATININE 0.49 0.63  CALCIUM 9.3 9.0   PT/INR No results for input(s): LABPROT, INR in the last 72 hours. ABG No results for input(s): PHART, HCO3 in the last 72 hours.  Invalid input(s): PCO2, PO2  Studies/Results: Ct Abdomen Pelvis W Contrast  Result Date: 12/28/2016 CLINICAL DATA:  Abdominal pain, signs and symptoms of urinary retention. EXAM: CT ABDOMEN AND PELVIS WITH CONTRAST TECHNIQUE: Multidetector CT imaging of the abdomen and pelvis was performed using the standard protocol following bolus administration of intravenous contrast. CONTRAST:  75mL ISOVUE-300 IOPAMIDOL (ISOVUE-300) INJECTION 61% COMPARISON:  04/10/2008 ultrasound, abdomen radiographs from 04/16/2008 FINDINGS: Lower chest: Chronic elevation of the left hemidiaphragm. The visualized cardiac chambers are normal in size. No pericardial effusion. There is atelectasis at each lung base. Hepatobiliary: Coarse benign-appearing calcifications the right hepatic lobe. Cholecystectomy with mild reservoir effect likely accounting for mild intrahepatic  and extrahepatic ductal dilatation. Pancreas: Atrophy without ductal dilatation. Small 9 mm pancreatic head cyst is noted. Spleen: There is a subcapsular hematoma measuring 13.6 x 4.8 x 8.8 cm (CC by transverse by AP) faint internal hyperdensity may reflect areas of clot suggesting a subacute hematoma. No active hemorrhage identified. Adrenals/Urinary Tract: Normal bilateral adrenal glands and kidneys without obstructive uropathy. The urinary bladder is slightly thickened in appearance with irregular mucosal enhancement which may reflect a cystitis. Stomach/Bowel: The stomach is contracted. There is normal small bowel rotation. No bowel obstruction or inflammation. Moderate colonic stool burden. Appendix is not confidently identified. Vascular/Lymphatic: Bold aortoiliac atherosclerosis without aneurysm. Reproductive: Calcified uterine fibroids.  No adnexal masses. Other: No abdominal wall hernia or abnormality. No abdominopelvic ascites. Musculoskeletal: Changes of spina bifida along the lower lumbar spine with dextroconvex curvature. IMPRESSION: 1. Large splenic subcapsular hematoma measuring 13.6 x 4.8 x 8.8 cm without evidence of active hemorrhage. Mixed attenuation within may represent areas of clot suggesting a subacute injury. No free fluid. 2. Mildly thickened bladder wall with mucosal enhancement suggest cystitis. 3. Status post cholecystectomy with mild reservoir effect. 4. 9 mm pancreatic head cyst. Recommend follow up pre and post contrast MRI/MRCP or pancreatic protocol CT in 2 years. This recommendation follows ACR consensus guidelines: Management of Incidental Pancreatic Cysts: A White Paper of the ACR Incidental Findings Committee. J Am Coll Radiol 2017;14:911-923. Electronically Signed   By: Tollie Eth M.D.   On: 12/28/2016 18:08    Anti-infectives: Anti-infectives    None      Assessment/Plan: 76 y/o F Splenic hematoma -OK for PO -Monitor Hct -Bed  rest -Pain control -SCDs for now   LOS: 0 days    Marigene EhlersRamirez Jr., [facility] 12/29/2016

## 2016-12-29 NOTE — Care Management Obs Status (Signed)
MEDICARE OBSERVATION STATUS NOTIFICATION   Patient Details  Name: [name] MRN: [id number] Date of Birth: August 10, 1940   Medicare Observation Status Notification Given:  Yes    Yves DillJeffries, Katieann Hungate Christine, RN 12/29/2016, 5:37 PM

## 2016-12-30 ENCOUNTER — Encounter (HOSPITAL_COMMUNITY): Payer: Self-pay | Admitting: *Deleted

## 2016-12-30 DIAGNOSIS — D735 Infarction of spleen: Secondary | ICD-10-CM | POA: Diagnosis not present

## 2016-12-30 NOTE — Progress Notes (Signed)
Subjective/Chief Complaint: No events overnight.     Objective: Vital signs in last 24 hours: Temp:  [97.6 F (36.4 C)-98.2 F (36.8 C)] 98.2 F (36.8 C) (06/17 0501) Pulse Rate:  [81-83] 81 (06/17 0501) Resp:  [16] 16 (06/17 0501) BP: (113-132)/(62-66) 113/62 (06/17 0501) SpO2:  [94 %-95 %] 95 % (06/17 0501) Last BM Date:  (Prior to admission)  Intake/Output from previous day: 06/16 0701 - 06/17 0700 In: 250 [P.O.:250] Out: -  Intake/Output this shift: No intake/output data recorded.  Unable to communicate verbally Does not like to be examined.   General appearance: alert GI: soft, min TTP LUQ, no rebound/guarding  Lab Results:   Recent Labs  12/29/16 0518 12/29/16 2300  WBC 8.4 8.0  HGB 12.3 12.7  HCT 38.7 40.9  PLT 317 331   BMET  Recent Labs  12/28/16 1557 12/29/16 0518  NA 148* 148*  K 4.1 3.8  CL 116* 111  CO2 26 26  GLUCOSE 96 90  BUN 30* 20  CREATININE 0.49 0.63  CALCIUM 9.3 9.0   PT/INR No results for input(s): LABPROT, INR in the last 72 hours. ABG No results for input(s): PHART, HCO3 in the last 72 hours.  Invalid input(s): PCO2, PO2  Studies/Results: Ct Abdomen Pelvis W Contrast  Result Date: 12/28/2016 CLINICAL DATA:  Abdominal pain, signs and symptoms of urinary retention. EXAM: CT ABDOMEN AND PELVIS WITH CONTRAST TECHNIQUE: Multidetector CT imaging of the abdomen and pelvis was performed using the standard protocol following bolus administration of intravenous contrast. CONTRAST:  75mL ISOVUE-300 IOPAMIDOL (ISOVUE-300) INJECTION 61% COMPARISON:  04/10/2008 ultrasound, abdomen radiographs from 04/16/2008 FINDINGS: Lower chest: Chronic elevation of the left hemidiaphragm. The visualized cardiac chambers are normal in size. No pericardial effusion. There is atelectasis at each lung base. Hepatobiliary: Coarse benign-appearing calcifications the right hepatic lobe. Cholecystectomy with mild reservoir effect likely accounting for mild  intrahepatic and extrahepatic ductal dilatation. Pancreas: Atrophy without ductal dilatation. Small 9 mm pancreatic head cyst is noted. Spleen: There is a subcapsular hematoma measuring 13.6 x 4.8 x 8.8 cm (CC by transverse by AP) faint internal hyperdensity may reflect areas of clot suggesting a subacute hematoma. No active hemorrhage identified. Adrenals/Urinary Tract: Normal bilateral adrenal glands and kidneys without obstructive uropathy. The urinary bladder is slightly thickened in appearance with irregular mucosal enhancement which may reflect a cystitis. Stomach/Bowel: The stomach is contracted. There is normal small bowel rotation. No bowel obstruction or inflammation. Moderate colonic stool burden. Appendix is not confidently identified. Vascular/Lymphatic: Bold aortoiliac atherosclerosis without aneurysm. Reproductive: Calcified uterine fibroids.  No adnexal masses. Other: No abdominal wall hernia or abnormality. No abdominopelvic ascites. Musculoskeletal: Changes of spina bifida along the lower lumbar spine with dextroconvex curvature. IMPRESSION: 1. Large splenic subcapsular hematoma measuring 13.6 x 4.8 x 8.8 cm without evidence of active hemorrhage. Mixed attenuation within may represent areas of clot suggesting a subacute injury. No free fluid. 2. Mildly thickened bladder wall with mucosal enhancement suggest cystitis. 3. Status post cholecystectomy with mild reservoir effect. 4. 9 mm pancreatic head cyst. Recommend follow up pre and post contrast MRI/MRCP or pancreatic protocol CT in 2 years. This recommendation follows ACR consensus guidelines: Management of Incidental Pancreatic Cysts: A White Paper of the ACR Incidental Findings Committee. J Am Coll Radiol 2017;14:911-923. Electronically Signed   By: Tollie Eth M.D.   On: 12/28/2016 18:08    Anti-infectives: Anti-infectives    None      Assessment/Plan: 76 y/o F Splenic hematoma Intellectual disability -  OK for PO, advance diet to  full liquids.   -Monitor Hct, no current evidence of anemia. -Bed rest -Pain control -SCDs for now for VTE prophylaxis.     LOS: 0 days    [facility] Center[facility]ERLY,Omah Dewalt 12/30/2016

## 2016-12-30 NOTE — Plan of Care (Signed)
Problem: Safety: Goal: Ability to remain free from injury will improve Patient alert and oriented to self. Bed alarm on

## 2016-12-31 DIAGNOSIS — D735 Infarction of spleen: Secondary | ICD-10-CM | POA: Diagnosis not present

## 2016-12-31 NOTE — Clinical Social Work Note (Addendum)
[facility] from [facility]-- CSW called and confirmed. CSW spoke with Darra LisJameelah Pickard. CSW informed group home that plan is pt will d/c tomorrow. CSW confirmed that group home will come and get the pt after 1:00 tomorrow if CSW has called and pt has been d/c. Nurse from [facility], Ulyess BlossomDonna Steele will be coming to the hospital about 5:15 to be sure pt can medically return to group home--RN notified. CSW will follow up with group home on 6/19 if/when pt is d/c.   Velora MediateBridget Denisha Hoel, LCSWA [phone]

## 2016-12-31 NOTE — Progress Notes (Signed)
   Subjective/Chief Complaint: Pt con't to do well No c/o Taking some PO   Objective: Vital signs in last 24 hours: Temp:  [97.8 F (36.6 C)-98 F (36.7 C)] 97.9 F (36.6 C) (06/18 0434) Pulse Rate:  [61-83] 83 (06/18 0434) Resp:  [16] 16 (06/18 0434) BP: (121-137)/(64-86) 137/86 (06/18 0434) SpO2:  [96 %-98 %] 96 % (06/18 0434) Last BM Date:  (pta)  Intake/Output from previous day: 06/17 0701 - 06/18 0700 In: 480 [P.O.:480] Out: -  Intake/Output this shift: No intake/output data recorded.  General appearance: alert and cooperative Cardio: regular rate and rhythm, S1, S2 normal, no murmur, click, rub or gallop GI: soft, min ttp LUQ, nd, no rebound guarding  Lab Results:   Recent Labs  12/29/16 0518 12/29/16 2300  WBC 8.4 8.0  HGB 12.3 12.7  HCT 38.7 40.9  PLT 317 331   BMET  Recent Labs  12/28/16 1557 12/29/16 0518  NA 148* 148*  K 4.1 3.8  CL 116* 111  CO2 26 26  GLUCOSE 96 90  BUN 30* 20  CREATININE 0.49 0.63  CALCIUM 9.3 9.0   Assessment/Plan: 76 y/o F Splenic hematoma Intellectual disability -ADAT   -Hct stable. -mobilize as tol -Pain control -SCDs for now for VTE prophylaxis.    Dipso: plan to return to group home in next 1-2d   LOS: 0 days    Marigene EhlersRamirez Jr., [facility] 12/31/2016

## 2016-12-31 NOTE — Progress Notes (Signed)
PT Cancellation Note  Patient Details Name: [name] MRN: [id number] DOB: 06/21/1941   Cancelled Treatment:    Reason Eval/Treat Not Completed: PT screened, no needs identified, will sign off ([facility], dressing and feeding x 15 years. Pt does propel her WC in home. Manager of group home present to provide PLOF and agreeable to no acute therapy needs)   Aizlynn Digilio B Masashi Snowdon 12/31/2016, 12:04 PM  Delaney MeigsMaija Tabor Camiah Humm, PT [phone]

## 2017-01-01 DIAGNOSIS — D735 Infarction of spleen: Secondary | ICD-10-CM | POA: Diagnosis not present

## 2017-01-01 MED ORDER — BACITRACIN ZINC 500 UNIT/GM EX OINT: 1.0000 "application " | TOPICAL_OINTMENT | CUTANEOUS | 0 refills | Status: DC | PRN

## 2017-01-01 MED ORDER — BISACODYL 10 MG RE SUPP: 10.0000 mg | RECTAL | 0 refills | Status: DC

## 2017-01-01 NOTE — Care Management Note (Signed)
Case Management Note  Patient Details  Name: [name] MRN: [id number] Date of Birth: June 10, 1941  Subjective/Objective:   Pt admitted on 12/28/16 with large subcapsular splenic hematoma.  PTA, [facility] resided [facility]; she is mentally disabled and nonverbal.                   Action/Plan: CSW following to facilitate return to group home later today.    Expected Discharge Date:  01/01/17               Expected Discharge Plan:  Group Home  In-House Referral:  Clinical Social Work  Discharge planning Services  CM Consult  Post Acute Care Choice:    Choice offered to:     DME Arranged:    DME Agency:     HH Arranged:    HH Agency:     Status of Service:  Completed, signed off  If discussed at [employer] Meetings, dates discussed:    Additional Comments:  Quintella BatonJulie W. Hanny Elsberry, RN, BSN  Trauma/Neuro ICU Case Manager [phone]

## 2017-01-01 NOTE — [place] FL2 (Signed)
[place] MEDICAID FL2 LEVEL OF CARE SCREENING TOOL     IDENTIFICATION  Patient Name: Stacie CoachRebecca S Reyes Birthdate: June 28, 1941 Sex: female Admission Date (Current Location): 12/28/2016  [facility] [place]   Facility and Address:  [facility] [place], [address], [place], [place] [address]      Provider Number: [id number]  Attending Physician Name and Address:  Md, Trauma, MD  Relative Name and Phone Number:       Current Level of Care: Hospital Recommended Level of Care: Other (Comment) (Group HOme) Prior Approval Number:    Date Approved/Denied:   PASRR Number:    Discharge Plan: Other (Comment) (Group Home)    Current Diagnoses: Patient Active Problem List   Diagnosis Date Noted  . Spleen hematoma 12/28/2016  . Open wound of lesser toe of left foot with damage to nail 04/25/2015  . Seizure disorder (HCC) 05/28/2014  . Mental retardation, idiopathic profound 05/28/2014  . Spina bifida cystica (HCC) 05/28/2014  . 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 Congenital hypothyroidism 05/22/2013    Orientation RESPIRATION BLADDER Height & Weight      (Mentally delayed)  Normal Continent Weight: 130 lb (59 kg) Height:  5' (152.4 cm)  BEHAVIORAL SYMPTOMS/MOOD NEUROLOGICAL BOWEL NUTRITION STATUS      Continent Diet (Soft diet, thin liquids)  AMBULATORY STATUS COMMUNICATION OF NEEDS Skin   Total Care Verbally Normal                       Personal Care Assistance Level of Assistance  Bathing, Feeding, Dressing Bathing Assistance: Maximum assistance Feeding assistance: Maximum assistance Dressing Assistance: Maximum assistance     Functional Limitations Info  Sight, Speech, Hearing Sight Info: Adequate Hearing Info: Adequate Speech Info: Impaired (Incomprehensible)    SPECIAL CARE FACTORS FREQUENCY                      Contractures Contractures Info: Not present    Additional Factors Info  Allergies, Code Status Code Status Info: Full Code Allergies Info: Sulfa Antibiotics, Tegretol Carbamazepine           Current Medications (01/01/2017):  This is the current hospital active medication list Current Facility-Administered Medications  Medication Dose Route Frequency Provider Last Rate Last Dose  . 0.9 %  sodium chloride infusion   Intravenous Continuous Jimmye NormanWyatt, James, MD      . acetaminophen (TYLENOL) tablet 500 mg  500 mg Oral QHS Jimmye NormanWyatt, James, MD   500 mg at 12/31/16 2251  . bisacodyl (DULCOLAX) suppository 10 mg  10 mg Rectal Once per day on Mon Thu Wyatt, James, MD   10 mg at 12/31/16 0945  . famotidine (PEPCID) tablet 20 mg  20 mg Oral Daily Jimmye NormanWyatt, James, MD   20 mg at 12/31/16 0945  . lamoTRIgine (LAMICTAL) tablet 100 mg  100 mg Oral Daily Axel Filleramirez, Armando, MD   100 mg at 12/31/16 0945  . lamoTRIgine (LAMICTAL) tablet 50 mg  50 mg Oral QHS Axel Filleramirez, Armando, MD   50 mg at 12/31/16 2250  . levETIRAcetam (KEPPRA) 100 MG/ML solution 250 mg  250 mg Oral BID Jimmye NormanWyatt, James, MD   250 mg at 12/31/16 2252  . levothyroxine (SYNTHROID, LEVOTHROID) tablet 25 mcg  25 mcg Oral QAC breakfast Jimmye NormanWyatt, James, MD   25 mcg at 01/01/17 [phone]  . LORazepam (ATIVAN) tablet 0.5 mg  0.5  mg Oral Daily PRN Jimmye Norman, MD   0.5 mg at 12/31/16 2251  . mirtazapine (REMERON) tablet 7.5 mg  7.5 mg Oral QHS Jimmye Norman, MD   7.5 mg at 12/31/16 2250  . OLANZapine (ZYPREXA) tablet 5 mg  5 mg Oral QHS Jimmye Norman, MD   5 mg at 12/31/16 2252  . OLANZapine (ZYPREXA) tablet 5 mg  5 mg Oral Daily Jimmye Norman, MD   5 mg at 12/31/16 0944  . oxyCODONE-acetaminophen (PERCOCET/ROXICET) 5-325 MG per tablet 1 tablet  1 tablet Oral Q6H PRN Axel Filler, MD   1 tablet at 01/01/17 [phone]  . pantoprazole (PROTONIX) EC tablet 40 mg  40 mg Oral Daily Jimmye Norman, MD   40 mg at 12/30/16 0956   Or  . pantoprazole (PROTONIX) injection 40 mg  40  mg Intravenous Daily Jimmye Norman, MD   40 mg at 12/31/16 0944  . RESOURCE THICKENUP CLEAR   Oral PRN Axel Filler, MD         Discharge Medications: Please see discharge summary for a list of discharge medications.  Relevant Imaging Results:  Relevant Lab Results:   Additional Information    Camari Quintanilla A Tommie Bohlken, LCSW

## 2017-01-01 NOTE — Discharge Instructions (Signed)
Splenic Injury °A splenic injury is an injury of the spleen. The spleen is an organ located in the upper left area of your abdomen, just under your ribs. Your spleen filters and cleans your blood. It also stores blood cells and destroys cells that are worn out. Your spleen is also important for fighting disease. °Splenic injuries can vary. In some cases, the spleen may only be bruised with some bleeding inside the covering and around the spleen. Splenic injuries may also cause a deep tear or cut into the spleen (lacerated spleen). Some splenic injuries can cause the spleen to break open (rupture). °What are the causes? °Splenic injuries can be caused by a direct blow (blunt trauma) from: °· Car accidents. °· Contact sports. °· Falls. ° °Gunshot wounds or knife wounds (penetrating injuries) can also cause a splenic injury. °What increases the risk? °You may be at greater risk for a splenic injury if you have a disease that can cause the spleen to become enlarged. These include: °· Alcoholic liver disease. °· Viral infections, especially mononucleosis. ° °What are the signs or symptoms? °A minor splenic injury often causes no symptoms or only minor abdominal pain. If the injury causes severe bleeding, your blood pressure may rapidly decrease. This may cause: °· Dizziness or light-headedness. °· Rapid heart rate. °· Difficulty breathing. °· Fainting. °· Sweating with clammy skin. ° °Other signs and symptoms of a splenic injury can include: °· Very bad abdominal pain. °· Pain in the left shoulder. °· Pain when the abdomen is pressed (tenderness). °· Nausea. °· Swelling or bruising of the abdomen. ° °How is this diagnosed? °Your health care provider may suspect a splenic injury based on your signs and symptoms, especially if you were recently in an accident or you recently got hurt. Your health care provider will do a physical exam. Imaging tests may be done to confirm the diagnosis. These may  include: °· Ultrasound. °· CT scan. ° °You may have frequent blood tests for a few days after the injury to monitor your condition. °How is this treated? °Treatment depends on the type of splenic injury you have and how bad it is. Your health care provider will develop a treatment plan specific to your needs. °· Less severe injuries may be treated with: °? Observation. °? Interventional radiology. This involves using flexible tubes (catheters) to stop the bleeding from inside the blood vessel. °· More severe injuries may require hospitalization in the intensive care unit (ICU). While you are in the ICU: °? Your fluid and blood levels will be monitored closely. °? You will get fluids through an IV tube as needed. °? You may need follow-up scans to check whether your spleen is able to heal itself. If the injury is getting worse, you may need surgery. °? You may receive donated blood (transfusion). °? You may have a long needle inserted into your abdomen to remove any blood that has collected inside the spleen (hematoma). °· Surgery. If your blood pressure is too low, you may need emergency surgery. This may include: °? Repairing a laceration. °? Removing part of the spleen. °? Removing the entire spleen (splenectomy). ° °Follow these instructions at home: °· Take medicines only as directed by your health care provider. °· Rest at home. °· Do not participate in any strenuous activity until your health care provider says it is safe to do so. °· Do not lift anything that is heavier than 10 lb (4.5 kg). °· Do not participate in contact sports   until your health care provider says it is safe to do so. °· Stay up-to-date on vaccinations as told by your health care provider. °Contact a health care provider if: °· You have a fever. °· You have new or increasing pain in your abdomen or in your left shoulder. °Get help right away if: °· You have signs or symptoms of internal bleeding. Watch  for: °? Sweating. °? Dizziness. °? Weakness. °? Cold and clammy skin. °? Fainting. °· You have chest pain or difficulty breathing. °This information is not intended to replace advice given to you by your health care provider. Make sure you discuss any questions you have with your health care provider. °Document Released: 04/23/2006 Document Revised: 02/28/2016 Document Reviewed: 03/17/2014 °Elsevier Interactive Patient Education © 2017 Elsevier Inc. ° °

## 2017-01-01 NOTE — Clinical Social Work Note (Signed)
Due to pt's Severe intellectual disabilities SBIRT not appropriate. Pt is from Group Home. CSW will facilitate return to group home today.   Velora Mediate/Melanni Benway, [place] [phone]

## 2017-01-01 NOTE — Discharge Summary (Deleted)
[facility] Discharge Summary   Patient ID: [name] MRN: [id number] DOB/AGE: Nov 23, 1940 76 y.o.  Admit date: 12/28/2016 Discharge date: 01/01/2017  Discharge Diagnosis Patient Active Problem List   Diagnosis Date Noted  . Spleen hematoma 12/28/2016  . Open wound of lesser toe of left foot with damage to nail 04/25/2015  . Seizure disorder (HCC) 05/28/2014  . Mental retardation, idiopathic profound 05/28/2014  . Spina bifida cystica (HCC) 05/28/2014  . 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 Congenital hypothyroidism 05/22/2013   Consultants Social Work   Imaging: 12/28/16 CT ABD/PELV W/  1. Large splenic subcapsular hematoma measuring 13.6 x 4.8 x 8.8 cm without evidence of active hemorrhage. Mixed attenuation within may represent areas of clot suggesting a subacute injury. No free fluid. 2. Mildly thickened bladder wall with mucosal enhancement suggest cystitis. 3. Status post cholecystectomy with mild reservoir effect. 4. 9 mm pancreatic head cyst. Recommend follow up pre and post contrast MRI/MRCP or pancreatic protocol CT in 2 years. This recommendation follows ACR consensus guidelines: Management of Incidental Pancreatic Cysts: A White Paper of the ACR Incidental Findings Committee. J Am Coll Radiol 2017;14:911-923.  Procedures None  Hospital Course:  76 y.o. female who presented to [facility] from [facility] with subcapsular splenic hematoma of unknown etiology. At baseline she has profound mental retardation, congenital hypothyroidism, spina bifida, and lives at a group home.  She was admitted [facility]. During her hospital stay her pain was controlled and she remained hemodynamically stable. She remained on bedrest and up to wheelchair. At baseline she is wheelchair bound and requires total assist for bathing, dressing, feeding for the past 15+  years. On 01/01/17 the patient was stable for discharge back to her group home. Information regarding splenic injury has been provided - caregivers should be cautious of abdomen when moving and bathing patient. Avoid further traumatic injury as able. Call as needed for questions/concerns.  Physical Exam: General:  somnolent, NAD, comfortable, nonverbal  CV: RRR, pedal pulses 2+ BL Pulm: normal effort, clear to auscultation bilaterally with diminished breath sounds in lung bases Abd:  Soft, ND, mild tenderness LUQ, bowel sounds present in all 4 quadrants, no peritonitis MSK: diffuse muscle wasting, contracture right foot  Neuro: non-verbal, not following commands   Allergies as of 01/01/2017      Reactions   Sulfa Antibiotics    Tegretol [carbamazepine]       Medication List    TAKE these medications   acetaminophen 500 MG tablet Commonly known as:  TYLENOL Take 500 mg by mouth at bedtime. Take 325 2 tabs every 4 hours for temp or pain. Make sure to space 4-6 hours from bedtime dose.   aspirin 81 MG tablet Take 81 mg by mouth daily.   bacitracin ointment Apply 1 application topically 2 (two) times daily.   BALMEX SKIN PROTECTANT Oint Apply topically as needed.   CALCIUM 600 + D PO Take by mouth 2 (two) times daily.   carbamide peroxide 6.5 % OTIC solution Commonly known as:  DEBROX Place 5 drops into both ears every 21 ( twenty-one) days.   cetirizine 10 MG tablet Commonly known as:  ZYRTEC Take 10 mg by mouth daily as needed for allergies.   docusate 60 MG/15ML syrup Commonly known as:  COLACE Take 100 mg by mouth daily.   DULCOLAX 10 MG suppository Generic drug:  bisacodyl Place 10 mg rectally 2 (two) times  a week.   DUODERM HYDROACTIVE Misc Apply topically as needed.   FIRST-DUKES MOUTHWASH MT Use as directed in the mouth or throat.   folic acid 1 MG tablet Commonly known as:  FOLVITE Take 1 mg by mouth daily.   KEPPRA 250 MG tablet Generic drug:   levETIRAcetam Take 1 tablet (250 mg total) by mouth 2 (two) times daily.   lamoTRIgine 100 MG tablet Commonly known as:  LAMICTAL 100 mg. 100 mg in the morning  50 mg at bedtime   levothyroxine 25 MCG tablet Commonly known as:  SYNTHROID, LEVOTHROID 0.25 mcg daily. Take .5 daily   LORazepam 0.5 MG tablet Commonly known as:  ATIVAN Take 0.5 mg by mouth as needed for anxiety.   mirtazapine 15 MG tablet Commonly known as:  REMERON 7.5 mg at bedtime.   OLANZapine 5 MG tablet Commonly known as:  ZYPREXA Take 5 mg by mouth daily. @ 7pm   OLANZapine 5 MG tablet Commonly known as:  ZYPREXA Take 5 mg by mouth at bedtime. 1 tab PO as crisis med for agitation greater than 10 minutes, with approval of RN, per the Behavior support plan   ranitidine 150 MG tablet Commonly known as:  ZANTAC 150 mg daily.   trypsin-balsam-castor oil topical spray Commonly known as:  GRANULEX Apply 1 application topically as needed for wound care.          Signed: Hosie SpangleElizabeth Jude Linck, [facility] 01/01/2017, 9:54 AM Pager: [phone] Consults: [phone] Mon-Fri 7:00 am-4:30 pm Sat-Sun 7:00 am-11:30 am

## 2017-01-01 NOTE — Progress Notes (Signed)
Staff from [facility] here to transport pt back to group home.  Pt not exhibiting any s/sx of distress. Staff very familiar with patient and patient needs/habits. All discharge instructions reviewed with staff.

## 2017-01-01 NOTE — Discharge Summary (Signed)
[facility] Discharge Summary   Patient ID: [name] MRN: [id number] DOB/AGE: Nov 25, 1940 76 y.o.  Admit date: 12/28/2016 Discharge date: 01/01/2017  Discharge Diagnosis     Patient Active Problem List   Diagnosis Date Noted  . Spleen hematoma 12/28/2016  . Open wound of lesser toe of left foot with damage to nail 04/25/2015  . Seizure disorder (HCC) 05/28/2014  . Mental retardation, idiopathic profound 05/28/2014  . Spina bifida cystica (HCC) 05/28/2014  . 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 Congenital hypothyroidism 05/22/2013   Consultants Social Work   Imaging: 12/28/16 CT ABD/PELV W/  1. Large splenic subcapsular hematoma measuring 13.6 x 4.8 x 8.8 cm without evidence of active hemorrhage. Mixed attenuation within may represent areas of clot suggesting a subacute injury. No free fluid. 2. Mildly thickened bladder wall with mucosal enhancement suggest cystitis. 3. Status post cholecystectomy with mild reservoir effect. 4. 9 mm pancreatic head cyst. Recommend follow up pre and post contrast MRI/MRCP or pancreatic protocol CT in 2 years. This recommendation follows ACR consensus guidelines: Management of Incidental Pancreatic Cysts: A White Paper of the ACR Incidental Findings Committee. J Am Coll Radiol 2017;14:911-923.  Procedures None  Hospital Course:  76 y.o. female who presented to [facility] from [facility] with subcapsular splenic hematoma of unknown etiology. At baseline she has profound mental retardation, congenital hypothyroidism, spina bifida, and lives at a group home.  She was admitted to [facility] observation. During her hospital stay her pain was controlled and she remained hemodynamically stable. She remained on bedrest and up to wheelchair. At baseline she is wheelchair bound and requires total assist for bathing, dressing, feeding for  the past 15+ years. On 01/01/17 the patient was stable for discharge back to her group home. Information regarding splenic injury has been provided - caregivers should be cautious of abdomen when moving and bathing patient. Avoid further traumatic injury as able. Call as needed for questions/concerns.  Physical Exam: General:  somnolent, NAD, comfortable, nonverbal  CV: RRR, pedal pulses 2+ BL Pulm: normal effort, clear to auscultation bilaterally with diminished breath sounds in lung bases Abd:  Soft, ND, mild tenderness LUQ, bowel sounds present in all 4 quadrants, no peritonitis MSK: diffuse muscle wasting, contracture right foot  Neuro: non-verbal, not following commands  Allergies as of 01/01/2017      Reactions   Sulfa Antibiotics    Tegretol [carbamazepine]       Medication List    STOP taking these medications   trypsin-balsam-castor oil topical spray Commonly known as:  GRANULEX     TAKE these medications   acetaminophen 500 MG tablet Commonly known as:  TYLENOL Take 500 mg by mouth at bedtime. Take 325 2 tabs every 4 hours for temp or pain. Make sure to space 4-6 hours from bedtime dose.   aspirin 81 MG tablet Take 81 mg by mouth daily.   bacitracin ointment Apply 1 application topically as needed for wound care. What changed:  when to take this  reasons to take this   BALMEX SKIN PROTECTANT Oint Apply topically as needed.   bisacodyl 10 MG suppository Commonly known as:  DULCOLAX Place 1 suppository (10 mg total) rectally 3 (three) times a week. Start taking on:  01/02/2017 What changed:  when to take this   CALCIUM 600 + D PO Take by mouth 2 (two) times daily.   carbamide peroxide 6.5 % OTIC solution Commonly  known as:  DEBROX Place 5 drops into both ears every 21 ( twenty-one) days.   cetirizine 10 MG tablet Commonly known as:  ZYRTEC Take 10 mg by mouth daily as needed for allergies.   docusate 60 MG/15ML syrup Commonly known as:  COLACE Take  100 mg by mouth daily.   DUODERM HYDROACTIVE Misc Apply topically as needed.   FIRST-DUKES MOUTHWASH MT Use as directed in the mouth or throat.   folic acid 1 MG tablet Commonly known as:  FOLVITE Take 1 mg by mouth daily.   KEPPRA 250 MG tablet Generic drug:  levETIRAcetam Take 1 tablet (250 mg total) by mouth 2 (two) times daily.   lamoTRIgine 100 MG tablet Commonly known as:  LAMICTAL 100 mg. 100 mg in the morning  50 mg at bedtime   levothyroxine 25 MCG tablet Commonly known as:  SYNTHROID, LEVOTHROID 0.25 mcg daily. Take .5 daily   LORazepam 0.5 MG tablet Commonly known as:  ATIVAN Take 0.5 mg by mouth as needed for anxiety.   mirtazapine 15 MG tablet Commonly known as:  REMERON 7.5 mg at bedtime.   OLANZapine 5 MG tablet Commonly known as:  ZYPREXA Take 5 mg by mouth daily. @ 7pm   OLANZapine 5 MG tablet Commonly known as:  ZYPREXA Take 5 mg by mouth at bedtime. 1 tab PO as crisis med for agitation greater than 10 minutes, with approval of RN, per the Behavior support plan   ranitidine 150 MG tablet Commonly known as:  ZANTAC 150 mg daily.       Signed: Hosie SpangleElizabeth Sandee Bernath, [facility] 01/01/2017, 9:54 AM Pager: [phone] Consults: [phone] Mon-Fri 7:00 am-4:30 pm Sat-Sun 7:00 am-11:30 am

## 2017-01-09 ENCOUNTER — Inpatient Hospital Stay
Admission: EM | Admit: 2017-01-09 | Discharge: 2017-01-11 | DRG: 641 | Disposition: A | Discharge status: Hospice | Payer: Medicare Other | Attending: Internal Medicine | Admitting: Internal Medicine

## 2017-01-09 ENCOUNTER — Encounter: Payer: Self-pay | Admitting: *Deleted

## 2017-01-09 DIAGNOSIS — G40909 Epilepsy, unspecified, not intractable, without status epilepticus: Secondary | ICD-10-CM | POA: Diagnosis present

## 2017-01-09 DIAGNOSIS — Z515 Encounter for palliative care: Secondary | ICD-10-CM | POA: Diagnosis not present

## 2017-01-09 DIAGNOSIS — R64 Cachexia: Secondary | ICD-10-CM | POA: Diagnosis present

## 2017-01-09 DIAGNOSIS — N289 Disorder of kidney and ureter, unspecified: Secondary | ICD-10-CM

## 2017-01-09 DIAGNOSIS — Q059 Spina bifida, unspecified: Secondary | ICD-10-CM

## 2017-01-09 DIAGNOSIS — E878 Other disorders of electrolyte and fluid balance, not elsewhere classified: Secondary | ICD-10-CM

## 2017-01-09 DIAGNOSIS — H5500 Unspecified nystagmus: Secondary | ICD-10-CM | POA: Diagnosis present

## 2017-01-09 DIAGNOSIS — Z79899 Other long term (current) drug therapy: Secondary | ICD-10-CM | POA: Diagnosis not present

## 2017-01-09 DIAGNOSIS — F0390 Unspecified dementia without behavioral disturbance: Secondary | ICD-10-CM | POA: Diagnosis present

## 2017-01-09 DIAGNOSIS — I248 Other forms of acute ischemic heart disease: Secondary | ICD-10-CM | POA: Diagnosis present

## 2017-01-09 DIAGNOSIS — R778 Other specified abnormalities of plasma proteins: Secondary | ICD-10-CM | POA: Diagnosis present

## 2017-01-09 DIAGNOSIS — L89152 Pressure ulcer of sacral region, stage 2: Secondary | ICD-10-CM | POA: Diagnosis present

## 2017-01-09 DIAGNOSIS — Z7982 Long term (current) use of aspirin: Secondary | ICD-10-CM | POA: Diagnosis not present

## 2017-01-09 DIAGNOSIS — F329 Major depressive disorder, single episode, unspecified: Secondary | ICD-10-CM | POA: Diagnosis present

## 2017-01-09 DIAGNOSIS — E870 Hyperosmolality and hypernatremia: Secondary | ICD-10-CM | POA: Diagnosis not present

## 2017-01-09 DIAGNOSIS — R109 Unspecified abdominal pain: Secondary | ICD-10-CM | POA: Diagnosis not present

## 2017-01-09 DIAGNOSIS — F72 Severe intellectual disabilities: Secondary | ICD-10-CM | POA: Diagnosis present

## 2017-01-09 DIAGNOSIS — Z7401 Bed confinement status: Secondary | ICD-10-CM | POA: Diagnosis not present

## 2017-01-09 DIAGNOSIS — Z66 Do not resuscitate: Secondary | ICD-10-CM | POA: Diagnosis present

## 2017-01-09 DIAGNOSIS — Z681 Body mass index (BMI) 19 or less, adult: Secondary | ICD-10-CM | POA: Diagnosis not present

## 2017-01-09 DIAGNOSIS — F209 Schizophrenia, unspecified: Secondary | ICD-10-CM | POA: Diagnosis present

## 2017-01-09 DIAGNOSIS — N179 Acute kidney failure, unspecified: Secondary | ICD-10-CM

## 2017-01-09 DIAGNOSIS — Z7189 Other specified counseling: Secondary | ICD-10-CM | POA: Diagnosis not present

## 2017-01-09 DIAGNOSIS — R627 Adult failure to thrive: Secondary | ICD-10-CM | POA: Diagnosis present

## 2017-01-09 DIAGNOSIS — E031 Congenital hypothyroidism without goiter: Secondary | ICD-10-CM | POA: Diagnosis present

## 2017-01-09 DIAGNOSIS — Z882 Allergy status to sulfonamides status: Secondary | ICD-10-CM

## 2017-01-09 DIAGNOSIS — R4182 Altered mental status, unspecified: Secondary | ICD-10-CM

## 2017-01-09 DIAGNOSIS — E860 Dehydration: Secondary | ICD-10-CM | POA: Diagnosis present

## 2017-01-09 DIAGNOSIS — L8990 Pressure ulcer of unspecified site, unspecified stage: Secondary | ICD-10-CM | POA: Insufficient documentation

## 2017-01-09 DIAGNOSIS — R7989 Other specified abnormal findings of blood chemistry: Secondary | ICD-10-CM

## 2017-01-09 DIAGNOSIS — E876 Hypokalemia: Secondary | ICD-10-CM | POA: Diagnosis present

## 2017-01-09 DIAGNOSIS — E871 Hypo-osmolality and hyponatremia: Secondary | ICD-10-CM

## 2017-01-09 DIAGNOSIS — Z888 Allergy status to other drugs, medicaments and biological substances status: Secondary | ICD-10-CM

## 2017-01-09 LAB — BASIC METABOLIC PANEL WITH GFR
BUN: 88 mg/dL — ABNORMAL HIGH (ref 6–20)
BUN: 87 mg/dL — ABNORMAL HIGH (ref 6–20)
CO2: 30 mmol/L (ref 22–32)
CO2: 31 mmol/L (ref 22–32)
Calcium: 11.2 mg/dL — ABNORMAL HIGH (ref 8.9–10.3)
Calcium: 10.9 mg/dL — ABNORMAL HIGH (ref 8.9–10.3)
Chloride: >130 mmol/L (ref 101–111)
Chloride: >130 mmol/L (ref 101–111)
Creatinine, Ser: 1.6 mg/dL — ABNORMAL HIGH (ref 0.44–1.00)
Creatinine, Ser: 1.66 mg/dL — ABNORMAL HIGH (ref 0.44–1.00)
GFR calc Af Amer: 35 mL/min — ABNORMAL LOW (ref >60)
GFR calc Af Amer: 33 mL/min — ABNORMAL LOW (ref >60)
GFR calc non Af Amer: 30 mL/min — ABNORMAL LOW (ref >60)
GFR calc non Af Amer: 29 mL/min — ABNORMAL LOW (ref >60)
Glucose, Bld: 139 mg/dL — ABNORMAL HIGH (ref 65–99)
Glucose, Bld: 133 mg/dL — ABNORMAL HIGH (ref 65–99)
Potassium: 3.9 mmol/L (ref 3.5–5.1)
Potassium: 3.7 mmol/L (ref 3.5–5.1)
Sodium: 172 mmol/L (ref 135–145)
Sodium: 173 mmol/L (ref 135–145)

## 2017-01-09 LAB — CBC
HCT: 48.5 % — ABNORMAL HIGH (ref 35.0–47.0)
Hemoglobin: 15.5 g/dL (ref 12.0–16.0)
MCH: 32.6 pg (ref 26.0–34.0)
MCHC: 31.9 g/dL — ABNORMAL LOW (ref 32.0–36.0)
MCV: 102.2 fL — ABNORMAL HIGH (ref 80.0–100.0)
Platelets: 264 K/uL (ref 150–440)
RBC: 4.75 MIL/uL (ref 3.80–5.20)
RDW: 15.4 % — ABNORMAL HIGH (ref 11.5–14.5)
WBC: 13.3 K/uL — ABNORMAL HIGH (ref 3.6–11.0)

## 2017-01-09 LAB — TROPONIN I: Troponin I: 0.07 ng/mL (ref <0.03)

## 2017-01-09 LAB — CK: CK TOTAL: 132 U/L (ref 38–234)

## 2017-01-09 LAB — MAGNESIUM: Magnesium: 3.1 mg/dL — ABNORMAL HIGH (ref 1.7–2.4)

## 2017-01-09 MED ORDER — SODIUM CHLORIDE 0.9 % IV BOLUS (SEPSIS)
1000.0000 mL | Freq: Once | INTRAVENOUS | Status: AC
  Administered 2017-01-09: 1000 mL via INTRAVENOUS

## 2017-01-09 NOTE — ED Notes (Signed)
Sacral pad applied to pts Sacrum due to caregivers complaints of ulcer with "puss" draining". Upon assessment no pus or drainage found. Pt has redness noted but no ulcer noted at this time. PT has small blister noted to skin

## 2017-01-09 NOTE — ED Notes (Signed)
PT is on a pureed and thickened liquids diet per caregivers.

## 2017-01-09 NOTE — ED Provider Notes (Signed)
[facility] Emergency Department Provider Note  ____________________________________________  Time seen: Approximately 9:17 PM  I have reviewed the triage vital signs and the nursing notes.   HISTORY  Chief Complaint Abnormal Lab The patient history and examination is limited due to her mental retardation. HPI [name] is a 76 y.o. female from a group home with a history of mental retardation and schizophrenia, seizure disorder, spina bifida, and recent admission for subcapsular splenic hematoma brought for hypernatremia. The patient went for routine follow-up after her discharge from the hospital today and was found to have significant hypernatremia so was referred here for further evaluation. She is unable to give me any history. HER-2 caregivers are with her, and states that for the past 3 weeks, she has been refusing eating and drinking. He moans when she is sitting in the chair, and appears to be more comfortable when she is laying down. No history of nausea, vomiting, diarrhea, foul-smelling urine, fever or chills. The patient does have some altered mental status and is less responsive than usual. The caregivers also concerned about purulent discharge from a decubitus ulcer.   Past Medical History:  Diagnosis Date  . Depression   . Hypothyroidism   . MR (mental retardation)   . Schizophrenia ([place])   . Seizure ([place])   . Spinal bifida, closed     Patient Active Problem List   Diagnosis Date Noted  . Spleen hematoma 12/28/2016  . Open wound of lesser toe of left foot with damage to nail 04/25/2015  . Seizure disorder ([place]) 05/28/2014  . Mental retardation, idiopathic profound 05/28/2014  . 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 No pertinent surgical  history.  Current Outpatient Rx  . Order #: [id number] Class: Historical Med  . Order #: [id number] Class: Historical Med  . Order #: [id number] Class: Normal  . Order #: [id number] Class: Normal  . Order #: [id number] Class: Historical Med  . Order #: [id number] Class: Historical Med  . Order #: [id number] Class: Historical Med  . Order #: [id number] Class: Historical Med  . Order #: [id number] Class: Historical Med  . Order #: [id number] Class: Historical Med  . Order #: [id number] Class: Historical Med  . Order #: [id number] Class: Normal  . Order #: [id number] Class: Historical Med  . Order #: [id number] Class: Historical Med  . Order #: [id number] Class: Historical Med  . Order #: [id number] Class: Historical Med  . Order #: [id number] Class: Historical Med  . Order #: [id number] Class: Historical Med  . Order #: [id number] Class: Historical Med  . Order #: [id number] Class: Historical Med    Allergies Sulfa antibiotics and Tegretol [carbamazepine]  History reviewed. No pertinent family history.  Social History Social History  Substance Use Topics  . Smoking status: Never Smoker  . Smokeless tobacco: Never Used  . Alcohol use No    Review of Systems Limited due to patient mental mental retardation. The patient caregivers say that she has had groaning with sitting, decreased responsiveness, and decreased by mouth intake for the past 3 weeks.  ____________________________________________   PHYSICAL EXAM:  VITAL SIGNS: ED Triage Vitals  Enc Vitals Group     BP 01/09/17 1838 130/70     Pulse Rate 01/09/17 1838 97     Resp 01/09/17 1838 18     Temp 01/09/17 1838 97.5 F (36.4 C)     Temp Source 01/09/17 1838 Axillary  SpO2 01/09/17 1838 97 %     Weight 01/09/17 1839 130 lb (59 kg)     Height 01/09/17 1839 5' (1.524 m)     Head Circumference --      Peak Flow --      Pain Score --      Pain Loc --      Pain Edu? --      Excl. in [place]? --     Constitutional: The patient is alert with her eyes open and  occasionally groans but otherwise is nonverbal. She is chronically ill-appearing but nontoxic. Eyes: Conjunctivae are normal.  EOMI. No scleral icterus. The patient has discharge in the eyes bilaterally. Head: Atraumatic. Nose: No congestion/rhinnorhea. Mouth/Throat: Mucous membranes are very dry.  Neck: No stridor.  She has kyphosis of the neck.  Cardiovascular: Normal rate, regular rhythm. No murmurs, rubs or gallops.  Respiratory: Normal respiratory effort.  No accessory muscle use or retractions. Lungs CTAB.  No wheezes, rales or ronchi. Gastrointestinal: Soft, and nondistended.  The patient has some mild moaning diffusely in all quadrants on my examination. She also moans when I touch any other part of her body. No guarding or rebound.  No peritoneal signs. Musculoskeletal: No LE edema. Extremities are contracted. Neurologic:  alert but nonverbal. Face and smile are symmetric.  EOMI.  Moves all extremities well. Skin:  Skin is warm, dry. Psychiatric: Unable to assess due to patient AMS  ____________________________________________   LABS (all labs ordered are listed, but only abnormal results are displayed)  Labs Reviewed  CBC - Abnormal; Notable for the following:       Result Value   WBC 13.3 (*)    HCT 48.5 (*)    MCV 102.2 (*)    MCHC 31.9 (*)    RDW 15.4 (*)    All other components within normal limits  BASIC METABOLIC PANEL - Abnormal; Notable for the following:    Sodium 172 (*)    Chloride >130 (*)    Glucose, Bld 139 (*)    BUN 88 (*)    Creatinine, Ser 1.60 (*)    Calcium 11.2 (*)    GFR calc non Af Amer 30 (*)    GFR calc Af Amer 35 (*)    All other components within normal limits  CULTURE, BLOOD (ROUTINE X 2)  CULTURE, BLOOD (ROUTINE X 2)  URINE CULTURE  BASIC METABOLIC PANEL  URINALYSIS, COMPLETE (UACMP) WITH MICROSCOPIC  MAGNESIUM  TROPONIN I  CK   ____________________________________________  EKG  ED ECG REPORT I, Eula Listen, the  attending physician, personally viewed and interpreted this ECG.   Date: 01/09/2017  EKG Time: 2133  Rate: 89  Rhythm: normal sinus rhythm  Axis: normal  Intervals:prolonged QTc  ST&T Change: no STEMI; + LBBB  ____________________________________________  RADIOLOGY  No results found.  ____________________________________________   PROCEDURES  Procedure(s) performed: None  Procedures  Critical Care performed: Yes, see critical care note(s) ____________________________________________   INITIAL IMPRESSION / ASSESSMENT AND PLAN / ED COURSE  Pertinent labs & imaging results that were available during my care of the patient were reviewed by me and considered in my medical decision making (see chart for details).  76 y.o. female with a history of mental retardation, recent admission for subcapsular splenic hematoma, with 3 weeks of not eating and drinking presenting for hypernatremia. Here, she is hemodynamically stable. However, her first BASIC metabolic panel shows a sodium of 172 and extremely elevated chloride, and a renal insufficiency.  We will get a repeat to ensure that this is not lab air, and initiate intravenous fluids. The patient has been admitted for further evaluation and treatment.  ____________________________________________  FINAL CLINICAL IMPRESSION(S) / ED DIAGNOSES  Final diagnoses:  Hyponatremia  Renal insufficiency  Hyperchloremia  Altered mental status, unspecified altered mental status type         NEW MEDICATIONS STARTED DURING THIS VISIT:  New Prescriptions   No medications on file      Eula Listen, MD 01/09/17 2329

## 2017-01-09 NOTE — H&P (Signed)
[facility] [place] [place] NAME: [name]    MR#:  [id number]  DATE OF BIRTH:  [birth year]  DATE OF ADMISSION:  01/09/2017  PRIMARY CARE PHYSICIAN: Sofie Hartigan, MD   REQUESTING/REFERRING PHYSICIAN: Mariea Clonts, MD  CHIEF COMPLAINT:   Chief Complaint  Patient presents with  . Abnormal Lab    HISTORY OF PRESENT ILLNESS:  [name]  is a 76 y.o. female who presents with Decreased by mouth intake. Patient has a myriad of medical problems, see medical history below. Caregivers from the facility where she lives state that for the past several days she's not been eating or drinking. She was recently here in the hospital for splenic hematoma, but does not seem to be having any further complications from that. Here she was found to be severely hypernatremic. Hospitalists were called for admission  PAST MEDICAL HISTORY:   Past Medical History:  Diagnosis Date  . Depression   . Hypothyroidism   . MR (mental retardation)   . Schizophrenia ([place])   . Seizure ([place])   . Spinal bifida, closed     PAST SURGICAL HISTORY:   Past Surgical History:  Procedure Laterality Date  . CHOLECYSTECTOMY      SOCIAL HISTORY:   Social History  Substance Use Topics  . Smoking status: Never Smoker  . Smokeless tobacco: Never Used  . Alcohol use No    FAMILY HISTORY:  History reviewed. No pertinent family history.  DRUG ALLERGIES:   Allergies  Allergen Reactions  . Sulfa Antibiotics   . Tegretol [Carbamazepine]     MEDICATIONS AT HOME:   Prior to Admission medications   Medication Sig Start Date End Date Taking? Authorizing Provider  acetaminophen (TYLENOL) 500 MG tablet Take 500 mg by mouth at bedtime. Take 325 2 tabs every 4 hours for temp or pain. Make sure to space 4-6 hours from bedtime dose.    [provider]  aspirin 81 MG tablet Take 81 mg by mouth daily.    [provider]  bacitracin ointment Apply 1  application topically as needed for wound care. 01/01/17   Jill Alexanders, PA-C  bisacodyl (DULCOLAX) 10 MG suppository Place 1 suppository (10 mg total) rectally 3 (three) times a week. 01/02/17   Jill Alexanders, PA-C  Calcium Carb-Cholecalciferol (CALCIUM 600 + D PO) Take by mouth 2 (two) times daily.    [provider]  carbamide peroxide (DEBROX) 6.5 % otic solution Place 5 drops into both ears every 21 ( twenty-one) days.    [provider]  cetirizine (ZYRTEC) 10 MG tablet Take 10 mg by mouth daily as needed for allergies.     [provider]  Diphenhyd-Hydrocort-Nystatin (FIRST-DUKES MOUTHWASH MT) Use as directed in the mouth or throat.    [provider]  docusate (COLACE) 60 MG/15ML syrup Take 100 mg by mouth daily.     [provider]  folic acid (FOLVITE) 1 MG tablet Take 1 mg by mouth daily.    [provider]  Hydroactive Dressings (DUODERM HYDROACTIVE) MISC Apply topically as needed.    [provider]  KEPPRA 250 MG tablet Take 1 tablet (250 mg total) by mouth 2 (two) times daily. 05/29/16   Ward Givens, NP  lamoTRIgine (LAMICTAL) 100 MG tablet 100 mg. 100 mg in the morning  50 mg at bedtime 05/21/13   [provider]  levothyroxine (SYNTHROID, LEVOTHROID) 25 MCG tablet 0.25 mcg daily. Take .5 daily  05/21/13   [provider]  LORazepam (ATIVAN) 0.5 MG tablet Take 0.5 mg by mouth as needed for anxiety.    [provider]  mirtazapine (REMERON) 15 MG tablet 7.5 mg at bedtime.  05/21/13   [provider]  OLANZapine (ZYPREXA) 5 MG tablet Take 5 mg by mouth daily. @ 7pm    [provider]  OLANZapine (ZYPREXA) 5 MG tablet Take 5 mg by mouth at bedtime. 1 tab PO as crisis med for agitation greater than 10 minutes, with approval of RN, per the Behavior support plan    [provider]  ranitidine (ZANTAC) 150 MG tablet 150 mg daily. 05/21/13   [provider]   Skin Protectants, Misc. (BALMEX SKIN PROTECTANT) OINT Apply topically as needed.    [provider]    REVIEW OF SYSTEMS:  Review of Systems  Unable to perform ROS: Medical condition     VITAL SIGNS:   Vitals:   01/09/17 1838 01/09/17 1839 01/09/17 2251  BP: 130/70  127/60  Pulse: 97  78  Resp: 18  (!) 22  Temp: 97.5 F (36.4 C)    TempSrc: Axillary    SpO2: 97%  100%  Weight:  59 kg (130 lb)   Height:  5' (1.524 m)    Wt Readings from Last 3 Encounters:  01/09/17 59 kg (130 lb)  12/28/16 59 kg (130 lb)  12/28/16 59 kg (130 lb)    PHYSICAL EXAMINATION:  Physical Exam  Vitals reviewed. Constitutional: She appears well-developed and well-nourished. No distress.  HENT:  Head: Normocephalic and atraumatic.  Extremely dry mucous membranes  Eyes: Conjunctivae and EOM are normal. Pupils are equal, round, and reactive to light. No scleral icterus.  Neck: Normal range of motion. Neck supple. No JVD present. No thyromegaly present.  Cardiovascular: Normal rate, regular rhythm and intact distal pulses.  Exam reveals no gallop and no friction rub.   No murmur heard. Respiratory: Effort normal and breath sounds normal. No respiratory distress. She has no wheezes. She has no rales.  GI: Soft. Bowel sounds are normal. She exhibits no distension. There is no tenderness.  Musculoskeletal: Normal range of motion. She exhibits no edema.  No arthritis, no gout  Lymphadenopathy:    She has no cervical adenopathy.  Neurological:  Unable to assess due to patient condition  Skin: Skin is warm and dry. No rash noted. No erythema.  Tenting, very slow capillary refill  Psychiatric:  Unable to assess due to patient condition    LABORATORY PANEL:   CBC  Recent Labs Lab 01/09/17 1841  WBC 13.3*  HGB 15.5  HCT 48.5*  PLT 264   ------------------------------------------------------------------------------------------------------------------  Chemistries   Recent  Labs Lab 01/09/17 2122  NA 173*  K 3.7  CL >130*  CO2 31  GLUCOSE 133*  BUN 87*  CREATININE 1.66*  CALCIUM 10.9*  MG 3.1*   ------------------------------------------------------------------------------------------------------------------  Cardiac Enzymes  Recent Labs Lab 01/09/17 2122  TROPONINI 0.07*   ------------------------------------------------------------------------------------------------------------------  RADIOLOGY:  No results found.  EKG:   Orders placed or performed during the hospital encounter of 01/09/17  . ED EKG  . ED EKG  . EKG 12-Lead  . EKG 12-Lead    IMPRESSION AND PLAN:  Principal Problem:   Hypernatremia - sodium was in the 170s. Patient has about a 7 L free water deficit. We will start her on D5W, monitor her sodium every 4 hours to ensure that it does not fall too quickly. Active Problems:  Elevated troponin - trend troponins, suspect this may be due to to demand from severe dehydration   AKI (acute kidney injury) ([place]) - due to her dehydration, IV fluids as above, avoid nephrotoxins and monitor for improvement   Congenital hypothyroidism - home dose thyroid replacement   Severe intellectual disabilities - patient has minimal interaction at baseline, And no interaction currently. Caregiver state that she usually has to be fed her meals   Seizure disorder ([place]) - home dose antiepileptics  All the records are reviewed and case discussed with ED provider. Management plans discussed with the patient and/or family.  DVT PROPHYLAXIS: SubQ heparin  GI PROPHYLAXIS: H2 blocker  ADMISSION STATUS: Inpatient  CODE STATUS: Full, though this will have to be clarified with her healthcare power of attorney when they're available during the daytime. Code Status History    Date Active Date Inactive Code Status Order ID Comments User Context   12/28/2016 11:07 PM 01/01/2017  5:56 PM Full Code [id number]  Judeth Horn, MD ED      TOTAL TIME TAKING  CARE OF THIS PATIENT: 45 minutes.   Jannifer Franklin, Zerick Prevette Williamson 01/09/2017, 11:10 PM  [facility]  Office  [phone]  CC: Primary care physician; Sofie Hartigan, MD  Note:  This document was prepared using Dragon voice recognition software and may include unintentional dictation errors.

## 2017-01-09 NOTE — ED Notes (Signed)
PT still has no urine output. Urine can be seen in tip of catheter. Placement checked again and verified. Fluids continue to hang. Caregivers remain at bedside and pt is sleeping at this time. Vitals WNL and pt in NAD.

## 2017-01-09 NOTE — ED Triage Notes (Signed)
Pt arrives for abnormal NA, states she was seen by PCP today for a follow-up after a hematoma was on her spleen, caregivers with pt, pt has hx of mental retardation

## 2017-01-10 DIAGNOSIS — L8990 Pressure ulcer of unspecified site, unspecified stage: Secondary | ICD-10-CM | POA: Insufficient documentation

## 2017-01-10 DIAGNOSIS — Z515 Encounter for palliative care: Secondary | ICD-10-CM

## 2017-01-10 DIAGNOSIS — Z7189 Other specified counseling: Secondary | ICD-10-CM

## 2017-01-10 DIAGNOSIS — E870 Hyperosmolality and hypernatremia: Principal | ICD-10-CM

## 2017-01-10 LAB — BASIC METABOLIC PANEL
BUN: 79 mg/dL — ABNORMAL HIGH (ref 6–20)
CO2: 30 mmol/L (ref 22–32)
Calcium: 9.6 mg/dL (ref 8.9–10.3)
Creatinine, Ser: 1.13 mg/dL — ABNORMAL HIGH (ref 0.44–1.00)
GFR calc non Af Amer: 46 mL/min — ABNORMAL LOW (ref >60)
GFR, EST AFRICAN AMERICAN: 53 mL/min — AB (ref >60)
GLUCOSE: 163 mg/dL — AB (ref 65–99)
Potassium: 3.3 mmol/L — ABNORMAL LOW (ref 3.5–5.1)
Sodium: 167 mmol/L (ref 135–145)

## 2017-01-10 LAB — CBC
HCT: 41.9 % (ref 35.0–47.0)
HEMOGLOBIN: 13.7 g/dL (ref 12.0–16.0)
MCH: 33.6 pg (ref 26.0–34.0)
MCHC: 32.6 g/dL (ref 32.0–36.0)
MCV: 103.2 fL — AB (ref 80.0–100.0)
Platelets: 179 10*3/uL (ref 150–440)
RBC: 4.06 MIL/uL (ref 3.80–5.20)
RDW: 15.6 % — ABNORMAL HIGH (ref 11.5–14.5)
WBC: 10.1 10*3/uL (ref 3.6–11.0)

## 2017-01-10 LAB — TROPONIN I
TROPONIN I: 0.1 ng/mL — AB (ref <0.03)
Troponin I: 0.09 ng/mL (ref <0.03)

## 2017-01-10 LAB — MRSA PCR SCREENING: MRSA by PCR: NEGATIVE

## 2017-01-10 LAB — SODIUM: SODIUM: 173 mmol/L — AB (ref 135–145)

## 2017-01-10 MED ORDER — MORPHINE SULFATE (CONCENTRATE) 10 MG/0.5ML PO SOLN
5.0000 mg | ORAL | Status: DC | PRN
  Administered 2017-01-10 – 2017-01-11 (×3): 5 mg via ORAL
  Filled 2017-01-10 (×4): qty 1

## 2017-01-10 MED ORDER — MORPHINE SULFATE 20 MG/5ML PO SOLN: 5.0000 mg | ORAL | 0 refills | Status: AC | PRN

## 2017-01-10 MED ORDER — ONDANSETRON HCL 4 MG/2ML IJ SOLN: 4.0000 mg | Freq: Four times a day (QID) | INTRAMUSCULAR | Status: DC | PRN

## 2017-01-10 MED ORDER — POTASSIUM CL IN DEXTROSE 5% 20 MEQ/L IV SOLN
20.0000 meq | INTRAVENOUS | Status: DC
  Administered 2017-01-10 – 2017-01-11 (×3): 20 meq via INTRAVENOUS
  Filled 2017-01-10 (×7): qty 1000

## 2017-01-10 MED ORDER — HEPARIN SODIUM (PORCINE) 5000 UNIT/ML IJ SOLN: 5000.0000 [IU] | Freq: Three times a day (TID) | INTRAMUSCULAR | Status: DC

## 2017-01-10 MED ORDER — DEXTROSE 5 % IV SOLN
INTRAVENOUS | Status: DC
  Administered 2017-01-10: 02:00:00 via INTRAVENOUS

## 2017-01-10 MED ORDER — FAMOTIDINE 20 MG PO TABS: 20.0000 mg | ORAL_TABLET | Freq: Every day | ORAL | Status: DC

## 2017-01-10 MED ORDER — MORPHINE SULFATE (CONCENTRATE) 10 MG/0.5ML PO SOLN
5.0000 mg | ORAL | Status: DC | PRN
  Administered 2017-01-10 – 2017-01-11 (×2): 5 mg via SUBLINGUAL
  Filled 2017-01-10: qty 1

## 2017-01-10 MED ORDER — LEVOTHYROXINE SODIUM 25 MCG PO TABS: 25.0000 ug | ORAL_TABLET | Freq: Every day | ORAL | Status: DC

## 2017-01-10 MED ORDER — OLANZAPINE 5 MG PO TABS
5.0000 mg | ORAL_TABLET | Freq: Every day | ORAL | Status: DC
  Filled 2017-01-10 (×2): qty 1

## 2017-01-10 MED ORDER — ASPIRIN 81 MG PO CHEW: 81.0000 mg | CHEWABLE_TABLET | Freq: Every day | ORAL | Status: DC

## 2017-01-10 MED ORDER — ONDANSETRON HCL 4 MG PO TABS: 4.0000 mg | ORAL_TABLET | Freq: Four times a day (QID) | ORAL | Status: DC | PRN

## 2017-01-10 MED ORDER — MORPHINE SULFATE (PF) 2 MG/ML IV SOLN: 1.0000 mg | INTRAVENOUS | Status: AC

## 2017-01-10 MED ORDER — POTASSIUM CHLORIDE CRYS ER 20 MEQ PO TBCR: 40.0000 meq | EXTENDED_RELEASE_TABLET | Freq: Once | ORAL | Status: DC

## 2017-01-10 MED ORDER — LORAZEPAM 0.5 MG PO TABS: 0.5000 mg | ORAL_TABLET | ORAL | Status: DC | PRN

## 2017-01-10 MED ORDER — MECLIZINE HCL 12.5 MG PO TABS
12.5000 mg | ORAL_TABLET | Freq: Two times a day (BID) | ORAL | Status: DC | PRN
  Filled 2017-01-10: qty 1

## 2017-01-10 MED ORDER — LAMOTRIGINE 100 MG PO TABS: 100.0000 mg | ORAL_TABLET | Freq: Two times a day (BID) | ORAL | Status: DC

## 2017-01-10 MED ORDER — MECLIZINE HCL 12.5 MG PO TABS: 12.5000 mg | ORAL_TABLET | ORAL | Status: AC

## 2017-01-10 MED ORDER — MIRTAZAPINE 15 MG PO TABS: 7.5000 mg | ORAL_TABLET | Freq: Every day | ORAL | Status: DC

## 2017-01-10 MED ORDER — LEVETIRACETAM 250 MG PO TABS
250.0000 mg | ORAL_TABLET | Freq: Two times a day (BID) | ORAL | Status: DC
  Filled 2017-01-10 (×5): qty 1

## 2017-01-10 MED ORDER — ACETAMINOPHEN 650 MG RE SUPP: 650.0000 mg | Freq: Four times a day (QID) | RECTAL | Status: DC | PRN

## 2017-01-10 MED ORDER — ACETAMINOPHEN 325 MG PO TABS: 650.0000 mg | ORAL_TABLET | Freq: Four times a day (QID) | ORAL | Status: DC | PRN

## 2017-01-10 NOTE — Progress Notes (Signed)
PT Cancellation Note  Patient Details Name: [name] MRN: [id number] DOB: 10/20/1940   Cancelled Treatment:    Reason Eval/Treat Not Completed: Other (comment). PT spoke with RN prior to attempting eval, as pt had d/c and palliative care orders. RN stated pt is to be discharged soon and is not appropriate for PT at this time (palliative care).  Zerita BoersJennifer Miller, PT,DPT 01/10/17 2:31 PM     Miller,Jennifer L 01/10/2017, 2:30 PM

## 2017-01-10 NOTE — NC FL2 (Signed)
[place] MEDICAID FL2 LEVEL OF CARE SCREENING TOOL     IDENTIFICATION  Patient Name: [name] Birthdate: 21-Jan-1941 Sex: female Admission Date (Current Location): 01/09/2017  [place] and [place] Number:  Haynes Bast [id number] [facility] Facility and Address:  [facility], [address] [place], [place] [address]      Provider Number: [id number]  Attending Physician Name and Address:  Adrian Saran, MD  Relative Name and Phone Number:  Norberto Sorenson Relative   [phone] or [facility] Other [phone]     Current Level of Care: Hospital Recommended Level of Care: Other (Comment) [facility]) Prior Approval Number:    Date Approved/Denied:   PASRR Number:    Discharge Plan: Other (Comment) [facility])    Current Diagnoses: Patient Active Problem List   Diagnosis Date Noted  . Pressure injury of skin 01/10/2017  . Hypernatremia 01/09/2017  . Elevated troponin 01/09/2017  . AKI (acute kidney injury) (HCC) 01/09/2017  . Spleen hematoma 12/28/2016  . Open wound of lesser toe of left foot with damage to nail 04/25/2015  . Seizure disorder (HCC) 05/28/2014  . Mental retardation, idiopathic profound 05/28/2014  . Spina bifida cystica (HCC) 05/28/2014  . Generalized nonconvulsive epilepsy (HCC) 05/22/2013  . 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 Congenital hypothyroidism 05/22/2013    Orientation RESPIRATION BLADDER Height & Weight     Self  Normal Continent Weight: 82 lb 14.4 oz (37.6 kg) Height:  5' (152.4 cm)  BEHAVIORAL SYMPTOMS/MOOD NEUROLOGICAL BOWEL NUTRITION STATUS      Continent  (Dysphagia diet)  AMBULATORY STATUS COMMUNICATION OF NEEDS Skin   Total Care Verbally PU Stage and Appropriate Care   PU Stage 2 Dressing:  (PRN)                   Personal Care Assistance Level of Assistance  Bathing, Feeding, Dressing Bathing Assistance: Maximum assistance Feeding assistance: Maximum  assistance Dressing Assistance: Maximum assistance     Functional Limitations Info  Sight, Speech, Hearing Sight Info: Adequate Hearing Info: Adequate Speech Info: Impaired    SPECIAL CARE FACTORS FREQUENCY                       Contractures Contractures Info: Not present    Additional Factors Info  Allergies, Code Status Code Status Info: DNR Allergies Info: SULFA ANTIBIOTICS, TEGRETOL CARBAMAZEPINE            Current Medications (01/10/2017):  This is the current hospital active medication list Current Facility-Administered Medications  Medication Dose Route Frequency Provider Last Rate Last Dose  . acetaminophen (TYLENOL) tablet 650 mg  650 mg Oral Q6H PRN Oralia Manis, MD       Or  . acetaminophen (TYLENOL) suppository 650 mg  650 mg Rectal Q6H PRN Oralia Manis, MD      . dextrose 5 % with KCl 20 mEq / L  infusion  20 mEq Intravenous Continuous Lateef, [place], MD 125 mL/hr at 01/10/17 1230 20 mEq at 01/10/17 1230  . famotidine (PEPCID) tablet 20 mg  20 mg Oral Daily Oralia Manis, MD      . lamoTRIgine (LAMICTAL) tablet 100 mg  100 mg Oral BID Oralia Manis, MD      . levETIRAcetam (KEPPRA) tablet 250 mg  250 mg Oral BID Oralia Manis, MD      . levothyroxine (SYNTHROID, LEVOTHROID) tablet 25 mcg  25 mcg Oral QAC breakfast Oralia Manis, MD      .  LORazepam (ATIVAN) tablet 0.5 mg  0.5 mg Oral PRN Oralia Manis, MD      . meclizine (ANTIVERT) tablet 12.5 mg  12.5 mg Oral BID PRN Barbara Cower, NP      . meclizine (ANTIVERT) tablet 12.5 mg  12.5 mg Oral NOW Barbara Cower, NP      . mirtazapine (REMERON) tablet 7.5 mg  7.5 mg Oral QHS Oralia Manis, MD      . morphine 2 MG/ML injection 1 mg  1 mg Intravenous NOW Barbara Cower, NP      . morphine CONCENTRATE 10 MG/0.5ML oral solution 5 mg  5 mg Oral Q2H PRN Barbara Cower, NP       Or  . morphine CONCENTRATE 10 MG/0.5ML oral solution 5 mg  5 mg Sublingual Q2H PRN Barbara Cower, NP      . OLANZapine (ZYPREXA)  tablet 5 mg  5 mg Oral Daily Oralia Manis, MD      . ondansetron [facility]) tablet 4 mg  4 mg Oral Q6H PRN Oralia Manis, MD       Or  . ondansetron [facility]) injection 4 mg  4 mg Intravenous Q6H PRN Oralia Manis, MD         Discharge Medications: Please see discharge summary for a list of discharge medications.  START taking these medications   Details  morphine 20 MG/5ML solution Take 1.3 mLs (5.2 mg total) by mouth every 2 (two) hours as needed for pain. Qty: 30 mL, Refills: 0          CONTINUE these medications which have NOT CHANGED   Details  KEPPRA 250 MG tablet Take 1 tablet (250 mg total) by mouth 2 (two) times daily. Qty: 60 tablet, Refills: 11   Associated Diagnoses: Seizure disorder (HCC); Mental retardation, idiopathic profound    lamoTRIgine (LAMICTAL) 100 MG tablet 100 mg. 100 mg in the morning  50 mg at bedtime    levothyroxine (SYNTHROID, LEVOTHROID) 25 MCG tablet 0.25 mcg daily. Take .5 daily    LORazepam (ATIVAN) 0.5 MG tablet Take 0.5 mg by mouth as needed for anxiety.    mirtazapine (REMERON) 15 MG tablet 7.5 mg at bedtime.     !! OLANZapine (ZYPREXA) 5 MG tablet Take 5 mg by mouth daily. @ 7pm    !! OLANZapine (ZYPREXA) 5 MG tablet Take 5 mg by mouth at bedtime. 1 tab PO as crisis med for agitation greater than 10 minutes, with approval of RN, per the Behavior support plan    ranitidine (ZANTAC) 150 MG tablet 150 mg daily.     !! - Potential duplicate medications found. Please discuss with provider.       STOP taking these medications     acetaminophen (TYLENOL) 500 MG tablet      aspirin 81 MG tablet      bacitracin ointment      bisacodyl (DULCOLAX) 10 MG suppository      Calcium Carb-Cholecalciferol (CALCIUM 600 + D PO)      carbamide peroxide (DEBROX) 6.5 % otic solution      cetirizine (ZYRTEC) 10 MG tablet      Diphenhyd-Hydrocort-Nystatin (FIRST-DUKES MOUTHWASH MT)      docusate (COLACE) 60  MG/15ML syrup      folic acid (FOLVITE) 1 MG tablet      Hydroactive Dressings (DUODERM HYDROACTIVE) MISC      Skin Protectants, Misc. (BALMEX SKIN PROTECTANT) OINT     Relevant Imaging Results:  Relevant Lab  Results:   Additional Information SSN [id number]  Darleene Cleavernterhaus, Ignacia Gentzler R, [place]

## 2017-01-10 NOTE — Progress Notes (Signed)
Family Meeting Note  Advance Directive:yes  Today a meeting took place with the POA.  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 Case discussed with palliative care clinician  Time spent during discussion:20 minutes  Tanice Petre, MD

## 2017-01-10 NOTE — Progress Notes (Signed)
Dr. Juliene PinaMody notified no urine output from foley catheter today but bed pad was soaked. Foley catheter was found with think brown discharge on tip, balloon still filled laying between patients legs.

## 2017-01-10 NOTE — Progress Notes (Signed)
IV no longer flushes. IV attempt x2 by Shon MilletMeghan Oakley RN. IV Team consulted.

## 2017-01-10 NOTE — Clinical Social Work Note (Addendum)
CSW received consult that [facility].  CSW was informed that palliative is recommending home with hospice, CSW attempted to contact [name] nurse Stacie PraderDonna Reyes at [phone] to find out if they can accept patient back.  CSW was informed that niece who is the POA would like patient to return back to group home with hospice.  CSW awaiting call back from nurse at [name]    3:00pm  CSW received phone call from [name] from [facility] and they can not accept patient back to the group home with hospice to follow.  CSW attempted to contact patient's family member [name] at [phone] and [phone], left a message awaiting for call back to discuss patient going to a hospice facility.  CSW updated bedside nurse and physician awaiting for call back from patient's family member.  CSW to continue to follow patient's progress throughout discharge planning.  [name], [name], [name] [phone]  01/10/2017 2:15 PM

## 2017-01-10 NOTE — Consult Note (Signed)
Consultation Note Date: 01/10/2017   Patient Name: [name]  DOB: 27-Dec-1940  MRN: [id number]  Age / Sex: 76 y.o., female  PCP: Sofie Hartigan, MD Referring Physician: Bettey Costa, MD  Reason for Consultation: Establishing goals of care  HPI/Patient Profile: 76 y.o. female  with past medical history of profound and severe cognitive impairment, depression, schizophrenia, depression, spina bifida, recent splenic hematoma (discharged from [facility] on 6/19 s/p observation only)  admitted on 01/09/2017 with decreased po intake for several days. Workup reveals she is dehydrated and hypernatremic.    Clinical Assessment and Goals of Care: Met with patient's HCPOA, [name] at bedside. [name] is Becky's niece and has been caring for her for 15 years. Becky lives at [facility] and has severe decline over the last several months. Becky used to talk and laugh. Now her speech is limited to one word phrases or answers. She does not smile. She is dependent for all ADL's. She has a bed to wheelchair existence. [name] has stopped eating and drinking on her own.  [name] notes [name] has no quality of life and her only goal is for [facility] to be comfortable, in no pain, and anxiety free.  [name] appears to be in pain. [name] states she has said the word "falling" a few times. There is some nystagmus present. When asked if she is in pain she says "yes". When asked if her stomach hurt she says "yes".  Discussed continued aggressive care v transition to comfort measures (focusing on symptom management, pain and anxiety medication, no artificial feeding, no artificial hydration, no diagnostics) with [name] [name] is very clear she wants comfort care only- when asked if she would want IV antibiotics for pneumonia or other infection, [name] answers No.   Patient would likely be eligible for residential hospice, however, [name] would  like for Becky to be able to return to [facility] with hospice because this is her home where she is familiar with the caretakers and surroundings. Patient has DNR currently in place.  Primary Decision Maker -   [name] HCPOA     SUMMARY OF RECOMMENDATIONS -DNR -Full comfort care -No further aggressive diagnostics -Social work referral for return to home facility with Hospice -? Vertigo- Meclizine 12.31m BID PRN for vertigo -Abdominal pain- Morphine 182mIV now -Morphine 65m69mL q2hr prn pain and SOB     Code Status/Advance Care Planning:  DNR  Palliative Prophylaxis:   Frequent Pain Assessment and Turn Reposition  Additional Recommendations (Limitations, Scope, Preferences):  Full Comfort Care  Prognosis:    < 2 weeks  [facility]charge [facility]: [facility]  Primary Diagnoses: Present on Admission: . Congenital hypothyroidism . Severe intellectual disabilities . Hypernatremia . Elevated troponin . AKI (acute kidney injury) ([facility] I have reviewed the medical record, interviewed the patient and family, and examined the patient. The following aspects are pertinent.  Past Medical History:  Diagnosis Date  . Depression   . Hypothyroidism   . MR (mental retardation)   .  Schizophrenia ([place])   . Seizure ([place])   . Spinal bifida, closed    Social History   Social History  . Marital status: Single    Spouse name: N/A  . Number of children: N/A  . Years of education: N/A   Social History Main Topics  . Smoking status: Never Smoker  . Smokeless tobacco: Never Used  . Alcohol use No  . Drug use: No  . Sexual activity: Not Asked   Other Topics Concern  . None   Social History Narrative   Patient lives at a group home and is here today with her caregivers, [name]   Patient does not drink any caffeine.   Patient is right-handed.   Patient is single.         History reviewed. No pertinent family history. Scheduled  Meds: . aspirin  81 mg Oral Daily  . famotidine  20 mg Oral Daily  . heparin  5,000 Units Subcutaneous Q8H  . lamoTRIgine  100 mg Oral BID  . levETIRAcetam  250 mg Oral BID  . levothyroxine  25 mcg Oral QAC breakfast  . mirtazapine  7.5 mg Oral QHS  .  morphine injection  1 mg Intravenous NOW  . OLANZapine  5 mg Oral Daily   Continuous Infusions: . dextrose 5 % with KCl 20 mEq / L     PRN Meds:.acetaminophen **OR** acetaminophen, LORazepam, meclizine, ondansetron **OR** ondansetron (ZOFRAN) IV Medications Prior to Admission:  Prior to Admission medications   Medication Sig Start Date End Date Taking? Authorizing Provider  acetaminophen (TYLENOL) 500 MG tablet Take 500 mg by mouth at bedtime. Take 325 2 tabs every 4 hours for temp or pain. Make sure to space 4-6 hours from bedtime dose.    [provider]  aspirin 81 MG tablet Take 81 mg by mouth daily.    [provider]  bacitracin ointment Apply 1 application topically as needed for wound care. 01/01/17   Jill Alexanders, PA-C  bisacodyl (DULCOLAX) 10 MG suppository Place 1 suppository (10 mg total) rectally 3 (three) times a week. 01/02/17   Jill Alexanders, PA-C  Calcium Carb-Cholecalciferol (CALCIUM 600 + D PO) Take by mouth 2 (two) times daily.    [provider]  carbamide peroxide (DEBROX) 6.5 % otic solution Place 5 drops into both ears every 21 ( twenty-one) days.    [provider]  cetirizine (ZYRTEC) 10 MG tablet Take 10 mg by mouth daily as needed for allergies.     [provider]  Diphenhyd-Hydrocort-Nystatin (FIRST-DUKES MOUTHWASH MT) Use as directed in the mouth or throat.    [provider]  docusate (COLACE) 60 MG/15ML syrup Take 100 mg by mouth daily.     [provider]  folic acid (FOLVITE) 1 MG tablet Take 1 mg by mouth daily.    [provider]  Hydroactive Dressings (DUODERM HYDROACTIVE) MISC Apply topically as needed.    [provider]  KEPPRA 250 MG tablet Take 1 tablet (250 mg total) by mouth 2 (two) times daily. 05/29/16   Ward Givens, NP  lamoTRIgine (LAMICTAL) 100 MG tablet 100 mg. 100 mg in the morning  50 mg at bedtime 05/21/13   [provider]  levothyroxine (SYNTHROID, LEVOTHROID) 25 MCG tablet 0.25 mcg daily. Take .5 daily 05/21/13   [provider]  LORazepam (ATIVAN) 0.5 MG tablet Take 0.5 mg by mouth as needed for anxiety.    [provider]  mirtazapine (REMERON)  15 MG tablet 7.5 mg at bedtime.  05/21/13   [provider]  OLANZapine (ZYPREXA) 5 MG tablet Take 5 mg by mouth daily. @ 7pm    [provider]  OLANZapine (ZYPREXA) 5 MG tablet Take 5 mg by mouth at bedtime. 1 tab PO as crisis med for agitation greater than 10 minutes, with approval of RN, per the Behavior support plan    [provider]  ranitidine (ZANTAC) 150 MG tablet 150 mg daily. 05/21/13   [provider]  Skin Protectants, Misc. (BALMEX SKIN PROTECTANT) OINT Apply topically as needed.    [provider]   Allergies  Allergen Reactions  . Sulfa Antibiotics   . Tegretol [Carbamazepine]    Review of Systems  Unable to perform ROS: Patient nonverbal    Physical Exam  Constitutional:  cachetic  Eyes:  +bilateral nystagmus  Cardiovascular: Normal rate and regular rhythm.   Musculoskeletal:  Arms and legs contracted  Neurological: She exhibits abnormal muscle tone. Coordination abnormal.  UTA orientation, speech mostly unintelligible, does not follow commands +nystagmus  Skin: There is pallor.  Pressure injury in sacral area  Psychiatric:  Answers some questions with one word answers or nodding  Nursing note and vitals reviewed.   Vital Signs: BP (!) 149/71 (BP Location: Left Arm)   Pulse 78   Temp 98.7 F (37.1 C)   Resp 20   Ht 5' (1.524 m)   Wt 37.6 kg (82 lb 14.4 oz)   SpO2 100%   BMI 16.19 kg/m          SpO2: SpO2: 100 % O2  Device:SpO2: 100 % O2 Flow Rate: .   IO: Intake/output summary:  Intake/Output Summary (Last 24 hours) at 01/10/17 1244 Last data filed at 01/10/17 0617  Gross per 24 hour  Intake               75 ml  Output                0 ml  Net               75 ml    LBM:   Baseline Weight: Weight: 59 kg (130 lb) Most recent weight: Weight: 37.6 kg (82 lb 14.4 oz)     Palliative Assessment/Data: PPS: 10%   Flowsheet Rows     Most Recent Value  Intake Tab  Referral Department  Hospitalist  Unit at Time of Referral  Oncology Unit  Palliative Care Primary Diagnosis  Neurology  Date Notified  01/10/17  Palliative Care Type  New Palliative care  Reason for referral  Clarify Goals of Care  Date of Admission  01/09/17  # of days IP prior to Palliative referral  1  Clinical Assessment  Psychosocial & Spiritual Assessment  Palliative Care Outcomes      Thank you for this consult. Palliative medicine will continue to follow and assist as needed.   Time In: 1200 Time Out: 1320 Time Total: 80 minutes Greater than 50%  of this time was spent counseling and coordinating care related to the above assessment and plan.  Signed by: Mariana Kaufman, AGNP-C Palliative Medicine    Please contact Palliative Medicine Team phone at [phone] for questions and concerns.  For individual provider: See Shea Evans

## 2017-01-10 NOTE — Progress Notes (Addendum)
[facility] [place] at [facility]   PATIENT NAME: [name]    MR#:  [id number]  DATE OF BIRTH:  11/27/1940  SUBJECTIVE:   Patient with advanced  dementia  REVIEW OF SYSTEMS:    Patient with advanced dementia unable to obtain ROS  Tolerating Diet: No appetite      DRUG ALLERGIES:   Allergies  Allergen Reactions  . Sulfa Antibiotics   . Tegretol [Carbamazepine]     VITALS:  Blood pressure (!) 149/71, pulse 78, temperature 98.7 F (37.1 C), resp. rate 20, height 5' (1.524 m), weight 37.6 kg (82 lb 14.4 oz), SpO2 100 %.  PHYSICAL EXAMINATION:  Constitutional: Chronically ill-appearing  HENT: Normocephalic. Marland Kitchen. Oropharynx is dry   Eyes: Conjunctivae and EOM are normal. PERRLA, no scleral icterus.  Neck: Normal ROM. Neck supple. No JVD. No tracheal deviation. CVS: RRR, S1/S2 +, no murmurs, no gallops, no carotid bruit.  Pulmonary: Effort and breath sounds normal, no stridor, rhonchi, wheezes, rales.  Abdominal: Soft. BS +,  no distension, tenderness, rebound or guarding.  Musculoskeletal: Normal range of motion. No edema and no tenderness.  Neuro: Alert moaning  patient does not cooperate with neuro exam   Skin: Skin is warm and dry. No rash noted. Psychiatric: Demented    LABORATORY PANEL:   CBC  Recent Labs Lab 01/10/17 0716  WBC 10.1  HGB 13.7  HCT 41.9  PLT 179   ------------------------------------------------------------------------------------------------------------------  Chemistries   Recent Labs Lab 01/09/17 2122  01/10/17 0716  NA 173*  < > 167*  K 3.7  --  3.3*  CL >130*  --  >130*  CO2 31  --  30  GLUCOSE 133*  --  163*  BUN 87*  --  79*  CREATININE 1.66*  --  1.13*  CALCIUM 10.9*  --  9.6  MG 3.1*  --   --   < > = values in this interval not displayed. ------------------------------------------------------------------------------------------------------------------  Cardiac Enzymes  Recent Labs Lab  01/09/17 2122 01/10/17 0207 01/10/17 0716  TROPONINI 0.07* 0.10* 0.09*   ------------------------------------------------------------------------------------------------------------------  RADIOLOGY:  No results found.   ASSESSMENT AND PLAN:   76 year old female with advanced dementia who presents with severe hypernatremia  1. Severe hypernatremia due to poor by mouth intake and advanced dementia Nephrology consult Continue D5W at 1 25 cc an hour   sodium level every 4 hours  2. Acute kidney injury in the setting of severe dehydration with minimal urine output due to severe dehydration Continue IV fluids Monitor output  3. Hypokalemia: Replete and recheck in a.m.  4. Advanced dementia with failure to thrive: Palliative care consultation place.  5. Elevated troponin due to demand ischemia. Patient is ruled out for non-ST elevation MI  6. History of seizures: Continue Keppra and lamotrigine 7. Hypothyroidism: Continue Synthroid   8. Depression: Continue Zyprexa and Remeron    Management plans discussed with the patient's POA and she is in agreement.  CODE STATUS: DNR  TOTAL TIME TAKING CARE OF THIS PATIENT: 30 minutes.     POSSIBLE D/C ??, DEPENDING ON CLINICAL CONDITION.   Kylin Genna M.D on 01/10/2017 at 12:14 PM  Between 7am to 6pm - Pager - [phone] After 6pm go to [email or web address] - password [email or web address]  [facility] [place] [facility]  Office  [phone]  CC: Primary care physician; Marina GoodellFeldpausch, Dale E, MD  Note: This dictation was prepared with Dragon dictation along with smaller phrase technology. Any transcriptional errors that result from this process are  unintentional. 

## 2017-01-10 NOTE — Discharge Summary (Addendum)
[facility] - [place] at [facility]   PATIENT NAME: Stacie MileRebecca Reyes    MR#:  [id number]  DATE OF BIRTH:  1941/01/06  DATE OF ADMISSION:  01/09/2017 ADMITTING PHYSICIAN: Oralia Manisavid Willis, MD  DATE OF DISCHARGE: 01/10/2017  PRIMARY CARE PHYSICIAN: Marina GoodellFeldpausch, Dale E, MD    ADMISSION DIAGNOSIS:  Hyperchloremia [E87.8] Hyponatremia [E87.1] Renal insufficiency [N28.9] Altered mental status, unspecified altered mental status type [R41.82]  DISCHARGE DIAGNOSIS:  Principal Problem:   Hypernatremia Active Problems:   Severe intellectual disabilities   Congenital hypothyroidism   Seizure disorder (HCC)   Elevated troponin   AKI (acute kidney injury) (HCC)   Pressure injury of skin   SECONDARY DIAGNOSIS:   Past Medical History:  Diagnosis Date  . Depression   . Hypothyroidism   . MR (mental retardation)   . Schizophrenia (HCC)   . Seizure (HCC)   . Spinal bifida, closed     HOSPITAL COURSE:    76 year old female with advanced dementia who presents with severe hypernatremia  1. Severe hypernatremia due to poor by mouth intake and advanced dementia   2. Acute kidney injury in the setting of severe dehydration with minimal urine output due to severe dehydration  3. Hypokalemia: Replete and recheck in a.m.  4. Advanced dementia with failure to thrive: Palliative care consultation placed POA would like hospice 5. Elevated troponin due to demand ischemia. Patient is ruled out for non-ST elevation MI  6. History of seizures: Continue Keppra and lamotrigine 7. Hypothyroidism: Continue Synthroid   8. Depression: Continue Zyprexa and Remeron   DISCHARGE CONDITIONS AND DIET:   Guarded As tolerated diet  CONSULTS OBTAINED:  Treatment Team:  Mady HaagensenLateef, Munsoor, MD  DRUG ALLERGIES:   Allergies  Allergen Reactions  . Sulfa Antibiotics   . Tegretol [Carbamazepine]     DISCHARGE MEDICATIONS:   Current Discharge Medication List    START taking  these medications   Details  morphine 20 MG/5ML solution Take 1.3 mLs (5.2 mg total) by mouth every 2 (two) hours as needed for pain. Qty: 30 mL, Refills: 0      CONTINUE these medications which have NOT CHANGED   Details  KEPPRA 250 MG tablet Take 1 tablet (250 mg total) by mouth 2 (two) times daily. Qty: 60 tablet, Refills: 11   Associated Diagnoses: Seizure disorder (HCC); Mental retardation, idiopathic profound    lamoTRIgine (LAMICTAL) 100 MG tablet 100 mg. 100 mg in the morning  50 mg at bedtime    levothyroxine (SYNTHROID, LEVOTHROID) 25 MCG tablet 0.25 mcg daily. Take .5 daily    LORazepam (ATIVAN) 0.5 MG tablet Take 0.5 mg by mouth as needed for anxiety.    mirtazapine (REMERON) 15 MG tablet 7.5 mg at bedtime.     !! OLANZapine (ZYPREXA) 5 MG tablet Take 5 mg by mouth daily. @ 7pm    !! OLANZapine (ZYPREXA) 5 MG tablet Take 5 mg by mouth at bedtime. 1 tab PO as crisis med for agitation greater than 10 minutes, with approval of RN, per the Behavior support plan    ranitidine (ZANTAC) 150 MG tablet 150 mg daily.     !! - Potential duplicate medications found. Please discuss with provider.    STOP taking these medications     acetaminophen (TYLENOL) 500 MG tablet      aspirin 81 MG tablet      bacitracin ointment      bisacodyl (DULCOLAX) 10 MG suppository      Calcium Carb-Cholecalciferol (CALCIUM 600 +  D PO)      carbamide peroxide (DEBROX) 6.5 % otic solution      cetirizine (ZYRTEC) 10 MG tablet      Diphenhyd-Hydrocort-Nystatin (FIRST-DUKES MOUTHWASH MT)      docusate (COLACE) 60 MG/15ML syrup      folic acid (FOLVITE) 1 MG tablet      Hydroactive Dressings (DUODERM HYDROACTIVE) MISC      Skin Protectants, Misc. (BALMEX SKIN PROTECTANT) OINT           Today   CHIEF COMPLAINT:   Patient dementia    VITAL SIGNS:  Blood pressure (!) 149/71, pulse 78, temperature 98.7 F (37.1 C), resp. rate 20, height 5' (1.524 m), weight 37.6 kg (82 lb 14.4  oz), SpO2 100 %.   REVIEW OF SYSTEMS:  Review of Systems  Unable to perform ROS: Dementia     PHYSICAL EXAMINATION:  Constitutional: Chronically ill-appearing  HENT: Normocephalic. Marland Kitchen Oropharynx is dry   Eyes: Conjunctivae and EOM are normal. PERRLA, no scleral icterus.  Neck: Normal ROM. Neck supple. No JVD. No tracheal deviation. CVS: RRR, S1/S2 +, no murmurs, no gallops, no carotid bruit.  Pulmonary: Effort and breath sounds normal, no stridor, rhonchi, wheezes, rales.  Abdominal: Soft. BS +,  no distension, tenderness, rebound or guarding.  Musculoskeletal: Normal range of motion. No edema and no tenderness.  Neuro: Alert moaning  patient does not cooperate with neuro exam   Skin: Skin is warm and dry. No rash noted. Psychiatric: Demented   DATA REVIEW:   CBC  Recent Labs Lab 01/10/17 0716  WBC 10.1  HGB 13.7  HCT 41.9  PLT 179    Chemistries   Recent Labs Lab 01/09/17 2122  01/10/17 0716  NA 173*  < > 167*  K 3.7  --  3.3*  CL >130*  --  >130*  CO2 31  --  30  GLUCOSE 133*  --  163*  BUN 87*  --  79*  CREATININE 1.66*  --  1.13*  CALCIUM 10.9*  --  9.6  MG 3.1*  --   --   < > = values in this interval not displayed.  Cardiac Enzymes  Recent Labs Lab 01/09/17 2122 01/10/17 0207 01/10/17 0716  TROPONINI 0.07* 0.10* 0.09*    Microbiology Results  @MICRORSLT48 @  RADIOLOGY:  No results found.    Current Discharge Medication List    START taking these medications   Details  morphine 20 MG/5ML solution Take 1.3 mLs (5.2 mg total) by mouth every 2 (two) hours as needed for pain. Qty: 30 mL, Refills: 0      CONTINUE these medications which have NOT CHANGED   Details  KEPPRA 250 MG tablet Take 1 tablet (250 mg total) by mouth 2 (two) times daily. Qty: 60 tablet, Refills: 11   Associated Diagnoses: Seizure disorder (HCC); Mental retardation, idiopathic profound    lamoTRIgine (LAMICTAL) 100 MG tablet 100 mg. 100 mg in the morning  50  mg at bedtime    levothyroxine (SYNTHROID, LEVOTHROID) 25 MCG tablet 0.25 mcg daily. Take .5 daily    LORazepam (ATIVAN) 0.5 MG tablet Take 0.5 mg by mouth as needed for anxiety.    mirtazapine (REMERON) 15 MG tablet 7.5 mg at bedtime.     !! OLANZapine (ZYPREXA) 5 MG tablet Take 5 mg by mouth daily. @ 7pm    !! OLANZapine (ZYPREXA) 5 MG tablet Take 5 mg by mouth at bedtime. 1 tab PO as crisis med for agitation greater  than 10 minutes, with approval of RN, per the Behavior support plan    ranitidine (ZANTAC) 150 MG tablet 150 mg daily.     !! - Potential duplicate medications found. Please discuss with provider.    STOP taking these medications     acetaminophen (TYLENOL) 500 MG tablet      aspirin 81 MG tablet      bacitracin ointment      bisacodyl (DULCOLAX) 10 MG suppository      Calcium Carb-Cholecalciferol (CALCIUM 600 + D PO)      carbamide peroxide (DEBROX) 6.5 % otic solution      cetirizine (ZYRTEC) 10 MG tablet      Diphenhyd-Hydrocort-Nystatin (FIRST-DUKES MOUTHWASH MT)      docusate (COLACE) 60 MG/15ML syrup      folic acid (FOLVITE) 1 MG tablet      Hydroactive Dressings (DUODERM HYDROACTIVE) MISC      Skin Protectants, Misc. (BALMEX SKIN PROTECTANT) OINT            Management plans discussed with the patient's POA who  is in agreement. Guarded condition  Patient should follow up with hopsice  CODE STATUS:     Code Status Orders        Start     Ordered   01/10/17 1214  Do not attempt resuscitation (DNR)  Continuous    Question Answer Comment  In the event of cardiac or respiratory ARREST Do not call a "code blue"   In the event of cardiac or respiratory ARREST Do not perform Intubation, CPR, defibrillation or ACLS   In the event of cardiac or respiratory ARREST Use medication by any route, position, wound care, and other measures to relive pain and suffering. May use oxygen, suction and manual treatment of airway obstruction as needed for  comfort.      01/10/17 1213    Code Status History    Date Active Date Inactive Code Status Order ID Comments User Context   01/10/2017  1:25 AM 01/10/2017 12:13 PM Full Code [id number]  Oralia Manis, MD Inpatient   12/28/2016 11:07 PM 01/01/2017  5:56 PM Full Code [id number]  Jimmye Norman, MD ED      TOTAL TIME TAKING CARE OF THIS PATIENT: 37 minutes.    Note: This dictation was prepared with Dragon dictation along with smaller phrase technology. Any transcriptional errors that result from this process are unintentional.  Dima Ferrufino M.D on 01/10/2017 at 12:44 PM  Between 7am to 6pm - Pager - [phone] After 6pm go to [email or web address] - Social research officer, government  [facility] [place] [facility]  Office  [phone]  CC: Primary care physician; Marina Goodell, MD

## 2017-01-10 NOTE — Consult Note (Signed)
[facility] CONSULT NOTE    Date: 01/10/2017                  Patient Name:  [name]  MRN: [id number]  DOB: 02/14/1941  Age / Sex: 76 y.o., female         PCP: Marina Goodell, MD                 Service Requesting Consult: hospitalist                 Reason for Consult: hypernatremia            History of Present Illness: Patient is a 76 y.o. female with a PMHx of Splenic hematoma, depression, hypothyroidism, severe mental retardation, seizure disorder, history of spinal bifida, who was admitted to [facility] on 01/09/2017 for evaluation of poor by mouth intake. She had recent splenic hematoma and was transferred to [facility] for this issue. Over the past several weeks she's had rather poor by mouth intake including solids and liquids.  Upon arrival here she was found have a very elevated serum sodium of 173. She was started on D5W. Serum sodium is currently 167. Potassium slightly low at 3.3. She was also found to have acute renal failure with a BUN of 87 and creatinine 1.6. BUN currently down to 79 with a creatinine of 1.13. A family member of hers is currently at the bedside. Patient unable to offer any history at this point in time.   Medications: Outpatient medications: Prescriptions Prior to Admission  Medication Sig Dispense Refill Last Dose  . acetaminophen (TYLENOL) 500 MG tablet Take 500 mg by mouth at bedtime. Take 325 2 tabs every 4 hours for temp or pain. Make sure to space 4-6 hours from bedtime dose.   prn at prn  . aspirin 81 MG tablet Take 81 mg by mouth daily.   12/28/2016 at am  . bacitracin ointment Apply 1 application topically as needed for wound care. 120 g 0   . bisacodyl (DULCOLAX) 10 MG suppository Place 1 suppository (10 mg total) rectally 3 (three) times a week. 12 suppository 0   . Calcium Carb-Cholecalciferol (CALCIUM 600 + D PO) Take by mouth 2 (two) times daily.   12/28/2016 at am  . carbamide peroxide (DEBROX) 6.5  % otic solution Place 5 drops into both ears every 21 ( twenty-one) days.   prn at prn  . cetirizine (ZYRTEC) 10 MG tablet Take 10 mg by mouth daily as needed for allergies.    prn at prn  . Diphenhyd-Hydrocort-Nystatin (FIRST-DUKES MOUTHWASH MT) Use as directed in the mouth or throat.   prn at prn  . docusate (COLACE) 60 MG/15ML syrup Take 100 mg by mouth daily.    12/27/2016 at pm  . folic acid (FOLVITE) 1 MG tablet Take 1 mg by mouth daily.   12/28/2016 at am  . Hydroactive Dressings (DUODERM HYDROACTIVE) MISC Apply topically as needed.   12/28/2016 at Unknown time  . KEPPRA 250 MG tablet Take 1 tablet (250 mg total) by mouth 2 (two) times daily. 60 tablet 11 12/28/2016 at am  . lamoTRIgine (LAMICTAL) 100 MG tablet 100 mg. 100 mg in the morning  50 mg at bedtime   12/28/2016 at am  . levothyroxine (SYNTHROID, LEVOTHROID) 25 MCG tablet 0.25 mcg daily. Take .5 daily   12/28/2016 at am  . LORazepam (ATIVAN) 0.5 MG tablet Take 0.5 mg by mouth as needed for anxiety.  prn at prn  . mirtazapine (REMERON) 15 MG tablet 7.5 mg at bedtime.    12/27/2016 at pm  . OLANZapine (ZYPREXA) 5 MG tablet Take 5 mg by mouth daily. @ 7pm   12/27/2016 at Unknown time  . OLANZapine (ZYPREXA) 5 MG tablet Take 5 mg by mouth at bedtime. 1 tab PO as crisis med for agitation greater than 10 minutes, with approval of RN, per the Behavior support plan   prn at prn  . ranitidine (ZANTAC) 150 MG tablet 150 mg daily.   12/28/2016 at am  . Skin Protectants, Misc. (BALMEX SKIN PROTECTANT) OINT Apply topically as needed.   prn at prn    Current medications: Current Facility-Administered Medications  Medication Dose Route Frequency Provider Last Rate Last Dose  . acetaminophen (TYLENOL) tablet 650 mg  650 mg Oral Q6H PRN Oralia ManisWillis, David, MD       Or  . acetaminophen (TYLENOL) suppository 650 mg  650 mg Rectal Q6H PRN Oralia ManisWillis, David, MD      . aspirin chewable tablet 81 mg  81 mg Oral Daily Oralia ManisWillis, David, MD      . dextrose 5 % solution    Intravenous Continuous Oralia ManisWillis, David, MD 75 mL/hr at 01/10/17 0133    . famotidine (PEPCID) tablet 20 mg  20 mg Oral Daily Oralia ManisWillis, David, MD      . heparin injection 5,000 Units  5,000 Units Subcutaneous Q8H Oralia ManisWillis, David, MD      . lamoTRIgine (LAMICTAL) tablet 100 mg  100 mg Oral BID Oralia ManisWillis, David, MD      . levETIRAcetam (KEPPRA) tablet 250 mg  250 mg Oral BID Oralia ManisWillis, David, MD      . levothyroxine (SYNTHROID, LEVOTHROID) tablet 25 mcg  25 mcg Oral QAC breakfast Oralia ManisWillis, David, MD      . LORazepam (ATIVAN) tablet 0.5 mg  0.5 mg Oral PRN Oralia ManisWillis, David, MD      . mirtazapine (REMERON) tablet 7.5 mg  7.5 mg Oral QHS Oralia ManisWillis, David, MD      . OLANZapine [facility](ZYPREXA) tablet 5 mg  5 mg Oral Daily Oralia ManisWillis, David, MD      . ondansetron [facility](ZOFRAN) tablet 4 mg  4 mg Oral Q6H PRN Oralia ManisWillis, David, MD       Or  . ondansetron [facility](ZOFRAN) injection 4 mg  4 mg Intravenous Q6H PRN Oralia ManisWillis, David, MD          Allergies: Allergies  Allergen Reactions  . Sulfa Antibiotics   . Tegretol [Carbamazepine]       Past Medical History: Past Medical History:  Diagnosis Date  . Depression   . Hypothyroidism   . MR (mental retardation)   . Schizophrenia (HCC)   . Seizure (HCC)   . Spinal bifida, closed      Past Surgical History: Past Surgical History:  Procedure Laterality Date  . CHOLECYSTECTOMY       Family History: History reviewed. No pertinent family history.   Social History: Social History   Social History  . Marital status: Single    Spouse name: N/A  . Number of children: N/A  . Years of education: N/A   Occupational History  . Not on file.   Social History Main Topics  . Smoking status: Never Smoker  . Smokeless tobacco: Never Used  . Alcohol use No  . Drug use: No  . Sexual activity: Not on file   Other Topics Concern  . Not on file   Social History Narrative  Patient lives at a group home and is here today with her caregivers, Jeani Sow.   Patient does not drink any  caffeine.   Patient is right-handed.   Patient is single.           Review of Systems: Patient unable to provide ROS due to severe mental retardation  Vital Signs: Blood pressure (!) 149/71, pulse 78, temperature 98.7 F (37.1 C), resp. rate 20, height 5' (1.524 m), weight 37.6 kg (82 lb 14.4 oz), SpO2 100 %.  Weight trends: Filed Weights   01/09/17 1839 01/10/17 0118  Weight: 59 kg (130 lb) 37.6 kg (82 lb 14.4 oz)    Physical Exam: General: Chronically ill appearing  Head: Normocephalic, atraumatic.  Eyes: Anicteric, EOMI  Nose: Mucous membranes dry, not inflammed, nonerythematous.  Throat: Oropharynx nonerythematous, extremely dry oral mucosa   Neck: Supple, trachea midline.  Lungs:  Normal respiratory effort. Clear to auscultation BL without crackles or wheezes.  Heart: RRR. S1 and S2 normal without gallop, murmur, or rubs.  Abdomen:  BS normoactive. Soft, Nondistended, non-tender.  No masses or organomegaly.  Extremities: No pretibial edema.  Neurologic: Awake, moaning, not following commands  Skin: No visible rashes, scars.    Lab results: Basic Metabolic Panel:  Recent Labs Lab 01/09/17 1954 01/09/17 2122 01/10/17 0207 01/10/17 0716  NA 172* 173* 173* 167*  K 3.9 3.7  --  3.3*  CL >130* >130*  --  >130*  CO2 30 31  --  30  GLUCOSE 139* 133*  --  163*  BUN 88* 87*  --  79*  CREATININE 1.60* 1.66*  --  1.13*  CALCIUM 11.2* 10.9*  --  9.6  MG  --  3.1*  --   --     Liver Function Tests: No results for input(s): AST, ALT, ALKPHOS, BILITOT, PROT, ALBUMIN in the last 168 hours. No results for input(s): LIPASE, AMYLASE in the last 168 hours. No results for input(s): AMMONIA in the last 168 hours.  CBC:  Recent Labs Lab 01/09/17 1841 01/10/17 0716  WBC 13.3* 10.1  HGB 15.5 13.7  HCT 48.5* 41.9  MCV 102.2* 103.2*  PLT 264 179    Cardiac Enzymes:  Recent Labs Lab 01/09/17 2122 01/10/17 0207 01/10/17 0716  CKTOTAL 132  --   --   TROPONINI  0.07* 0.10* 0.09*    BNP: Invalid input(s): POCBNP  CBG: No results for input(s): GLUCAP in the last 168 hours.  Microbiology: Results for orders placed or performed during the hospital encounter of 01/09/17  Blood culture (routine x 2)     Status: None (Preliminary result)   Collection Time: 01/09/17  9:22 PM  Result Value Ref Range Status   Specimen Description BLOOD BLOOD RIGHT FOREARM  Final   Special Requests   Final    BOTTLES DRAWN AEROBIC AND ANAEROBIC Blood Culture adequate volume   Culture NO GROWTH < 12 HOURS  Final   Report Status PENDING  Incomplete  Blood culture (routine x 2)     Status: None (Preliminary result)   Collection Time: 01/09/17  9:22 PM  Result Value Ref Range Status   Specimen Description BLOOD LEFT ANTECUBITAL  Final   Special Requests   Final    BOTTLES DRAWN AEROBIC AND ANAEROBIC Blood Culture adequate volume   Culture NO GROWTH < 12 HOURS  Final   Report Status PENDING  Incomplete  MRSA PCR Screening     Status: Abnormal   Collection Time: 01/10/17  1:12  AM  Result Value Ref Range Status   MRSA by PCR (A) NEGATIVE Final    INVALID, UNABLE TO DETERMINE THE PRESENCE OF TARGET DNA DUE TO SPECIMEN INTEGRITY. RECOLLECTION REQUESTED.    Comment:  SPOKE WITH DONNA HESLEP [phone] 01/10/17 SDR    Coagulation Studies: No results for input(s): LABPROT, INR in the last 72 hours.  Urinalysis: No results for input(s): COLORURINE, LABSPEC, PHURINE, GLUCOSEU, HGBUR, BILIRUBINUR, KETONESUR, PROTEINUR, UROBILINOGEN, NITRITE, LEUKOCYTESUR in the last 72 hours.  Invalid input(s): APPERANCEUR    Imaging:  No results found.   Assessment & Plan: Pt is a 76 y.o. female with a PMHx of Splenic hematoma, depression, hypothyroidism, severe mental retardation, seizure disorder, history of spinal bifida, who was admitted to [facility] on 01/09/2017 for evaluation of poor by mouth intake.  1.  Severe hypernatremia. 2.  Acute renal failure. 3.  Hypokalemia.   Plan:   Patient presents with poor by mouth intake at the facility that she resides at. She presented with severe hypernatremia with a serum sodium of 173. We will increase D5W rate to 125 cc per hour. We will also supplement D5W with potassium chloride. Her acute renal failure does appear to be improving with IV fluid hydration. She has minimal urine output at this time which is expected as her kidneys will attempt to re-claim all filtered sodium and water in the setting of severe dehydration.  Case discussed with hospitalist. Palliative care consultation will also be obtained. Further plan as patient progresses.

## 2017-01-10 NOTE — Progress Notes (Signed)
Initial Nutrition Assessment  DOCUMENTATION CODES:   Underweight  INTERVENTION:  No nutrition intervention appropriate at this time as patient is now discharging back to facility under hospice care with a focus on comfort. She has been refusing PO intake.  Can provide comfort feeds as requested by patient.  NUTRITION DIAGNOSIS:   Inadequate oral intake related to lethargy/confusion, dysphagia as evidenced by meal completion < 25%.  GOAL:   Patient will meet greater than or equal to 90% of their needs  MONITOR:   PO intake, Supplement acceptance, Labs, Weight trends, I & O's  REASON FOR ASSESSMENT:   Low Br[place], Other (Comment) (Low BMI)    ASSESSMENT:   76 year old female with PMHx of spina bifida, seizure disorder, schizophrenia, hypothyroidism, depression, severe intellectual disabilities who presented with decreased PO intake found to have hypernatremia, AKI.   -Had free water deficit of 7 L on admission.  -Patient now with discharge order to return to facility with hospice care. Per discharge summary focus of care will be on comfort.  -Still pending PMT note.   Met with patient at bedside. No caregiver or family members present. Patient unable to provide history. When reviewing symptoms she does report she is having abdominal pain. Attempted to call [facility] to obtain nutrition and weight history, but nobody answered. Left voicemail and requested return call. Did note that on admission someone from facility patient was on pureed diet with "thickened liquids" but did not specify what consistency of liquids.   Per chart review it appears patient's UBW may have been around 130 lbs. She has lost approximately 47 lbs (36% body weight) over an unknown time frame.  Medications reviewed and include: famotidine, levothyroxine, Remeron, D5 with KCl 20 mEq/L @ 125 ml/hr (3 L, 150 grams dextrose, 510 kcal daly).  Labs reviewed: Sodium 167 (trending down from 173 on  admission), Potassium 3.3, Chloride >130, BUN 79, Creatinine 1.13, elevated Troponin.   Nutrition-Focused physical exam completed. Findings are moderate fat depletion, moderate-severe muscle depletion, and no edema. Muscle depletion expected as patient is bed-bound.   Discussed with RN and NT. Patient refusing to eat. Only reports she does not want to eat. IV has stopped infusing. IV team was consulted.  Patient is likely malnourished. Unable to get accurate nutrition and weight history, so does not meet criteria at this time.   Diet Order:  DIET - DYS 1 Room service appropriate? Yes; Fluid consistency: Nectar Thick  Skin:  Wound (see comment) (Stg II to both right and left buttocks)  Last BM:  Unknown  Height:   Ht Readings from Last 1 Encounters:  01/10/17 5' (1.524 m)    Weight:   Wt Readings from Last 1 Encounters:  01/10/17 82 lb 14.4 oz (37.6 kg)    Ideal Body Weight:  45.5 kg  BMI:  Body mass index is 16.19 kg/m.  Estimated Nutritional Needs:   Kcal:  1200-1400  Protein:  55-65 grams  Fluid:  1-1.2 L/day  EDUCATION NEEDS:   Education needs no appropriate at this time  Willey Blade, MS, RD, LDN Pager: [phone] After Hours Pager: [phone]

## 2017-01-11 MED ORDER — LORAZEPAM 2 MG/ML IJ SOLN
0.5000 mg | Freq: Four times a day (QID) | INTRAMUSCULAR | Status: DC | PRN
  Administered 2017-01-11: 0.5 mg via INTRAVENOUS
  Filled 2017-01-11: qty 1

## 2017-01-11 NOTE — Progress Notes (Signed)
Rep from group home took patients clothes and shoes over to Hospice on discharge another rep came and picked up her wheelchair and cushion

## 2017-01-11 NOTE — Progress Notes (Addendum)
Clinical Social Worker (CSW) contacted patient's guardian [name] [phone] and provided hospice facility choice. [name] chose [name] [name]. Per [name] hospice liaison patient can come to hospice home today. CSW prepared D/C packet including DNR form. RN aware of above. MD aware of above. CSW contacted [name] RN and made her aware of above. Please reconsult if future social work needs arise. CSW signing off.   [employer]iley Aryelle Figg, LCSW [phone]

## 2017-01-11 NOTE — Progress Notes (Signed)
EMS notified for transport. Hospital care team made aware. Thank you. Dayna BarkerKaren Robertson RN, BSN, [facility] [place], hospital Liaison [phone] c

## 2017-01-11 NOTE — Progress Notes (Signed)
[facility]  ROUNDING NOTE   Subjective:  Late entry.  Patient resting comfortably in bed.  OM appears more moist. IVFs have been stopped.   Objective:  Vital signs in last 24 hours:  Temp:  [97.6 F (36.4 C)-97.7 F (36.5 C)] 97.7 F (36.5 C) (06/29 0442) Pulse Rate:  [80-90] 80 (06/29 0442) BP: (123-129)/(55-56) 123/55 (06/29 0442) SpO2:  [92 %-100 %] 100 % (06/29 0442)  Weight change:  Filed Weights   01/09/17 1839 01/10/17 0118  Weight: 59 kg (130 lb) 37.6 kg (82 lb 14.4 oz)    Intake/Output: I/O last 3 completed shifts: In: 1887.5 [I.V.:1887.5] Out: 0    Intake/Output this shift:  No intake/output data recorded.  Physical Exam: General: No acute distress  Head: Normocephalic, atraumatic. Semi-moist oral mucosal membranes  Eyes: Anicteric  Neck: Supple, trachea midline  Lungs:  Clear to auscultation, normal effort  Heart: S1S2 no rubs  Abdomen:  Soft, nontender, bowel sounds present  Extremities: no peripheral edema.  Neurologic: Lethargic but arousable  Skin: Diminished turgor       Basic Metabolic Panel:  Recent Labs Lab 01/09/17 1954 01/09/17 2122 01/10/17 0207 01/10/17 0716  NA 172* 173* 173* 167*  K 3.9 3.7  --  3.3*  CL >130* >130*  --  >130*  CO2 30 31  --  30  GLUCOSE 139* 133*  --  163*  BUN 88* 87*  --  79*  CREATININE 1.60* 1.66*  --  1.13*  CALCIUM 11.2* 10.9*  --  9.6  MG  --  3.1*  --   --     Liver Function Tests: No results for input(s): AST, ALT, ALKPHOS, BILITOT, PROT, ALBUMIN in the last 168 hours. No results for input(s): LIPASE, AMYLASE in the last 168 hours. No results for input(s): AMMONIA in the last 168 hours.  CBC:  Recent Labs Lab 01/09/17 1841 01/10/17 0716  WBC 13.3* 10.1  HGB 15.5 13.7  HCT 48.5* 41.9  MCV 102.2* 103.2*  PLT 264 179    Cardiac Enzymes:  Recent Labs Lab 01/09/17 2122 01/10/17 0207 01/10/17 0716  CKTOTAL 132  --   --   TROPONINI 0.07* 0.10* 0.09*     BNP: Invalid input(s): POCBNP  CBG: No results for input(s): GLUCAP in the last 168 hours.  Microbiology: Results for orders placed or performed during the hospital encounter of 01/09/17  Blood culture (routine x 2)     Status: None (Preliminary result)   Collection Time: 01/09/17  9:22 PM  Result Value Ref Range Status   Specimen Description BLOOD BLOOD RIGHT FOREARM  Final   Special Requests   Final    BOTTLES DRAWN AEROBIC AND ANAEROBIC Blood Culture adequate volume   Culture NO GROWTH 2 DAYS  Final   Report Status PENDING  Incomplete  Blood culture (routine x 2)     Status: None (Preliminary result)   Collection Time: 01/09/17  9:22 PM  Result Value Ref Range Status   Specimen Description BLOOD LEFT ANTECUBITAL  Final   Special Requests   Final    BOTTLES DRAWN AEROBIC AND ANAEROBIC Blood Culture adequate volume   Culture NO GROWTH 2 DAYS  Final   Report Status PENDING  Incomplete  MRSA PCR Screening     Status: Abnormal   Collection Time: 01/10/17  1:12 AM  Result Value Ref Range Status   MRSA by PCR (A) NEGATIVE Final    INVALID, UNABLE TO DETERMINE THE PRESENCE OF TARGET DNA  DUE TO SPECIMEN INTEGRITY. RECOLLECTION REQUESTED.    Comment:  SPOKE WITH DONNA HESLEP [phone] 01/10/17 SDR  MRSA PCR Screening     Status: None   Collection Time: 01/10/17  4:53 AM  Result Value Ref Range Status   MRSA by PCR NEGATIVE NEGATIVE Final    Comment:        The GeneXpert MRSA Assay (FDA approved for NASAL specimens only), is one component of a comprehensive MRSA colonization surveillance program. It is not intended to diagnose MRSA infection nor to guide or monitor treatment for MRSA infections.     Coagulation Studies: No results for input(s): LABPROT, INR in the last 72 hours.  Urinalysis: No results for input(s): COLORURINE, LABSPEC, PHURINE, GLUCOSEU, HGBUR, BILIRUBINUR, KETONESUR, PROTEINUR, UROBILINOGEN, NITRITE, LEUKOCYTESUR in the last 72 hours.  Invalid input(s):  APPERANCEUR    Imaging: No results found.   Medications:   . dextrose 5 % with KCl 20 mEq / L Stopped (01/11/17 1201)   . famotidine  20 mg Oral Daily  . lamoTRIgine  100 mg Oral BID  . levETIRAcetam  250 mg Oral BID  . levothyroxine  25 mcg Oral QAC breakfast  . mirtazapine  7.5 mg Oral QHS  . OLANZapine  5 mg Oral Daily   acetaminophen **OR** acetaminophen, LORazepam, meclizine, morphine CONCENTRATE **OR** morphine CONCENTRATE, ondansetron **OR** ondansetron (ZOFRAN) IV  Assessment/ Plan:  76 y.o. female with a PMHx of Splenic hematoma, depression, hypothyroidism, severe mental retardation, seizure disorder, history of spinal bifida, who [facility] for evaluation of poor by mouth intake.  1.  Severe hypernatremia. 2.  Acute renal failure. 3.  Hypokalemia.   Plan:  IV fluids have been stopped. It appears that the patient will be transitioning care over at the hospice home.  She will be receiving morphine solution while there.  Her diet will be as tolerated and she can be administered water as necessary. Unclear as to whether she will be able to take the water however. Her serum sodium did partially improve over the course of hospitalization. IV fluids have been stopped. Thanks for allowing us to participate.   LOS: 2 [name] 6/29/20182:04 PM

## 2017-01-11 NOTE — Progress Notes (Addendum)
New hospice home referral received from [place]. Patient is a 76 year old woman with a past medical history of profound and severe cognitive impairment, depression, schizophrenia, spina bifida, recent splenic hematoma (discharged from [facility] on 6/19 s/p observation only)  admitted on 01/09/2017 with decreased oral intake for several days. Workup revealed dehydration and hypernatremia. She received IV fluids with minimal improvement, she is not eating or drinking. Palliative Medicine was consulted for Goals of care and met with her guardian Sandi Raveling. Who chose to have her discharge to the hospice home with a focus on her comfort at end of life. Writer spoke on the phone with Mrs. Jones to initiate education regarding hospice services, philosophy and team approach to care with understanding voiced. Mrs. Ronnald Ramp has health issues and has chosen to go directly to the hospice home to sign all consents. She is available to meet with the hospice home social worker at [facility] care team all made aware. Patient seen lying in bed, opens her eyes when her name is called, grimace noted with any touch. No verbal response. She has required 3 doses of liquid morphine 5 mg each for pain and a dose of IV lorazepam for anxiety. Grimace reported to staff RN Gerald Stabs who will assess. Patient information faxed to referral. Writer to call report to the hospice home and notify EMS for transport. Signed DNR in place in patient's discharge packet. Please leave IV in place at discharge.  Thank you. Flo Shanks RN, BSN, [facility] [place], hospital Liaison [phone] c.

## 2017-01-14 LAB — CULTURE, BLOOD (ROUTINE X 2)
CULTURE: NO GROWTH
Culture: NO GROWTH
SPECIAL REQUESTS: ADEQUATE
Special Requests: ADEQUATE

## 2017-02-13 DEATH — deceased

## 2017-05-29 ENCOUNTER — Ambulatory Visit: Payer: Medicare Other | Admitting: Neurology

## 2017-05-30 ENCOUNTER — Encounter: Payer: Self-pay | Admitting: Neurology

## 2017-07-01 ENCOUNTER — Telehealth: Payer: Self-pay | Admitting: Adult Health

## 2017-07-01 NOTE — Telephone Encounter (Signed)
[employer] [phone] called to advise she had rec'd a letter the pt missed an appt. She said the pt passed in July 2018, she did not know the specific date.  FYI
